# Patient Record
Sex: Female | Born: 1975
Health system: Southern US, Community
[De-identification: ages and names within clinical notes are randomized; demographics above are authoritative.]

## PROBLEM LIST (undated history)

## (undated) DIAGNOSIS — R519 Headache, unspecified: Secondary | ICD-10-CM

## (undated) DIAGNOSIS — T7840XA Allergy, unspecified, initial encounter: Secondary | ICD-10-CM

## (undated) DIAGNOSIS — J45909 Unspecified asthma, uncomplicated: Secondary | ICD-10-CM

## (undated) DIAGNOSIS — Z5189 Encounter for other specified aftercare: Secondary | ICD-10-CM

## (undated) DIAGNOSIS — E785 Hyperlipidemia, unspecified: Secondary | ICD-10-CM

## (undated) DIAGNOSIS — K589 Irritable bowel syndrome without diarrhea: Secondary | ICD-10-CM

## (undated) DIAGNOSIS — R112 Nausea with vomiting, unspecified: Secondary | ICD-10-CM

## (undated) DIAGNOSIS — R51 Headache: Secondary | ICD-10-CM

## (undated) DIAGNOSIS — F419 Anxiety disorder, unspecified: Secondary | ICD-10-CM

## (undated) DIAGNOSIS — Z8249 Family history of ischemic heart disease and other diseases of the circulatory system: Secondary | ICD-10-CM

## (undated) DIAGNOSIS — R42 Dizziness and giddiness: Secondary | ICD-10-CM

## (undated) DIAGNOSIS — K219 Gastro-esophageal reflux disease without esophagitis: Secondary | ICD-10-CM

## (undated) DIAGNOSIS — E119 Type 2 diabetes mellitus without complications: Secondary | ICD-10-CM

## (undated) DIAGNOSIS — E079 Disorder of thyroid, unspecified: Secondary | ICD-10-CM

## (undated) DIAGNOSIS — Z9889 Other specified postprocedural states: Secondary | ICD-10-CM

## (undated) HISTORY — DX: Gastro-esophageal reflux disease without esophagitis: K21.9

## (undated) HISTORY — DX: Disorder of thyroid, unspecified: E07.9

## (undated) HISTORY — PX: TONSILLECTOMY: SUR1361

## (undated) HISTORY — DX: Anxiety disorder, unspecified: F41.9

## (undated) HISTORY — DX: Unspecified asthma, uncomplicated: J45.909

## (undated) HISTORY — DX: Family history of ischemic heart disease and other diseases of the circulatory system: Z82.49

## (undated) HISTORY — DX: Dizziness and giddiness: R42

## (undated) HISTORY — DX: Encounter for other specified aftercare: Z51.89

## (undated) HISTORY — DX: Headache: R51

## (undated) HISTORY — DX: Irritable bowel syndrome, unspecified: K58.9

## (undated) HISTORY — PX: ECTOPIC PREGNANCY SURGERY: SHX613

## (undated) HISTORY — DX: Headache, unspecified: R51.9

## (undated) HISTORY — DX: Allergy, unspecified, initial encounter: T78.40XA

## (undated) HISTORY — PX: MIDDLE EAR SURGERY: SHX713

## (undated) HISTORY — DX: Hyperlipidemia, unspecified: E78.5

---

## 2008-11-01 ENCOUNTER — Emergency Department (HOSPITAL_COMMUNITY): Admission: EM | Admit: 2008-11-01 | Discharge: 2008-11-01 | Payer: Self-pay | Admitting: Emergency Medicine

## 2008-11-17 ENCOUNTER — Encounter: Payer: Self-pay | Admitting: Gastroenterology

## 2008-12-14 ENCOUNTER — Encounter: Payer: Self-pay | Admitting: Gastroenterology

## 2008-12-14 ENCOUNTER — Encounter (INDEPENDENT_AMBULATORY_CARE_PROVIDER_SITE_OTHER): Payer: Self-pay | Admitting: *Deleted

## 2008-12-14 ENCOUNTER — Ambulatory Visit (HOSPITAL_COMMUNITY): Admission: RE | Admit: 2008-12-14 | Discharge: 2008-12-14 | Payer: Self-pay | Admitting: Gastroenterology

## 2008-12-15 ENCOUNTER — Encounter: Payer: Self-pay | Admitting: Gastroenterology

## 2009-01-24 ENCOUNTER — Encounter: Payer: Self-pay | Admitting: Gastroenterology

## 2009-03-08 ENCOUNTER — Ambulatory Visit: Payer: Self-pay | Admitting: Gastroenterology

## 2009-03-08 DIAGNOSIS — E739 Lactose intolerance, unspecified: Secondary | ICD-10-CM | POA: Insufficient documentation

## 2009-03-08 DIAGNOSIS — K219 Gastro-esophageal reflux disease without esophagitis: Secondary | ICD-10-CM | POA: Insufficient documentation

## 2009-03-08 DIAGNOSIS — K589 Irritable bowel syndrome without diarrhea: Secondary | ICD-10-CM | POA: Insufficient documentation

## 2009-04-03 ENCOUNTER — Ambulatory Visit (HOSPITAL_COMMUNITY): Admission: RE | Admit: 2009-04-03 | Discharge: 2009-04-03 | Payer: Self-pay | Admitting: Obstetrics and Gynecology

## 2010-02-20 ENCOUNTER — Encounter: Admission: RE | Admit: 2010-02-20 | Discharge: 2010-02-20 | Payer: Self-pay | Admitting: Otolaryngology

## 2013-12-06 ENCOUNTER — Other Ambulatory Visit: Payer: Self-pay

## 2013-12-06 DIAGNOSIS — Z1231 Encounter for screening mammogram for malignant neoplasm of breast: Secondary | ICD-10-CM

## 2013-12-22 ENCOUNTER — Ambulatory Visit: Payer: Self-pay

## 2013-12-23 ENCOUNTER — Ambulatory Visit
Admission: RE | Admit: 2013-12-23 | Discharge: 2013-12-23 | Disposition: A | Discharge status: Home / Self Care | Payer: BC Managed Care – PPO | Source: Ambulatory Visit

## 2013-12-23 DIAGNOSIS — Z1231 Encounter for screening mammogram for malignant neoplasm of breast: Secondary | ICD-10-CM

## 2013-12-24 ENCOUNTER — Other Ambulatory Visit: Payer: Self-pay | Admitting: Obstetrics and Gynecology

## 2013-12-24 DIAGNOSIS — R928 Other abnormal and inconclusive findings on diagnostic imaging of breast: Secondary | ICD-10-CM

## 2014-01-02 ENCOUNTER — Ambulatory Visit (INDEPENDENT_AMBULATORY_CARE_PROVIDER_SITE_OTHER): Payer: BC Managed Care – PPO | Admitting: Internal Medicine

## 2014-01-02 VITALS — BP 116/78 | HR 82 | Temp 98.7°F | Resp 14 | Ht 62.5 in | Wt 138.0 lb

## 2014-01-02 DIAGNOSIS — G4700 Insomnia, unspecified: Secondary | ICD-10-CM

## 2014-01-02 DIAGNOSIS — F411 Generalized anxiety disorder: Secondary | ICD-10-CM | POA: Insufficient documentation

## 2014-01-02 MED ORDER — ESCITALOPRAM OXALATE 10 MG PO TABS: 10.0000 mg | ORAL_TABLET | Freq: Every day | ORAL | Status: DC

## 2014-01-02 MED ORDER — ALPRAZOLAM 0.5 MG PO TABS
ORAL_TABLET | ORAL | Status: DC
Start: 2014-01-02 — End: 2015-01-18

## 2014-01-02 MED ORDER — TRIAZOLAM 0.25 MG PO TABS: ORAL_TABLET | ORAL | Status: DC

## 2014-01-02 NOTE — Progress Notes (Signed)
Subjective:    Patient ID: Shelly Lewis, female    DOB: 24-Oct-1975, 38 y.o.   MRN: [id number] This chart was scribed for [name], MD by Cathie Hoops, ED Scribe. The patient was seen in Room 2. The patient's care was started at 11:55 AM.   Chief Complaint  Patient presents with  . Anxiety   Patient Active Problem List   Diagnosis Date Noted  . LACTOSE INTOLERANCE 03/08/2009  . ESOPHAGEAL REFLUX 03/08/2009  . IBS 03/08/2009   Past Medical History  Diagnosis Date  . Allergy   . Anxiety   . Asthma   . Blood transfusion without reported diagnosis   . GERD (gastroesophageal reflux disease)   . Thyroid disease    No current outpatient prescriptions on file prior to visit.   No current facility-administered medications on file prior to visit.   No Known Allergies  HPI HPI Comments: Shelly Lewis is a 38 y.o. female who presents to the [facility] complaining of an anxiety disorder onset middle school. Pt states she has been suffering with her anxiety for the past several years. Pt states she works with [employer] and received a recommendation for Dr. Laney Pastor. She first noted anx in Mid sch, then HS and college Pt reports turmoil and her stress in her family life from an early age due to her parents' divorce. Pt states her parents constantly argued.  Pt reports her father is anxious but it comes out as requiring order. Pt states her sibllings are ages: 82, 71, and 30. Pt reports her older sister, had post-partum high anxiety but her anxiety has since been resolved. Pt states she has two children, ages 18 and 55, and both have diagnosed anxiety disorders. Pt reports only her son requires medication for his anxiety symptoms(lexapro)    Pt states she is having trouble staying asleep at night. Pt reports she regularly wakes up 3x/night. Pt reports in the mornings she often feeling fatigued. Pt reports associated chest tightness and GI issues. Pt states she  has tried melatonin before bed without relief. Pt denies feeling as if she is ever too tired to drive. Pt reports that 3 years ago, she often had panic attacks 3x/week. Pt reports she has had panic attacks 3 times this summer. Pt denies anything about these recent panic attacks was predictable. Pt states she was dealing with end-of-school year stress and her summer job hasn't been going well.   Pt reports her parents are around and her mother is more stressful than she is relieving. Pt reports her mother is often critical of her, and often tells her what she is doing wrong. Pt denies her mother is critical of her kids.   Pt states she went to  [employer] and majored in Scientist, physiological. Pt reports her mother was very critical of her, while she was in college.  Pt reports her anxiety didn't penalize her from a grade standpoint or relationship standpoint. Pt reports being married for 15 years. Pt reports when she is anxious she has very little patience with her husband. Pt reports going to therapy on her own. Pt reports this year has been a hard year for her marriage but it has started to improve. Pt states her husband is willing to do his part and he states he is willing to go see a therapist. Pt states her husband is a good father.   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 Pt states this will be her third year at [employer] and she gets along well with her principal. Pt has found she gets extremely anxious at the beginning of the school year. Pt states she likes routine and order but has not found this at her school. 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   Past Surgical History  Procedure Laterality Date  . Tonsillectomy    . Middle ear surgery-    . Ectopic pregnancy surgery-R     [place]  [facility] [place]-gave essential oils for her anxiety--no change Counseling twice in past and helped once Has appt Eino Farber but not til Sept  Review of Systems  Constitutional: Negative for appetite change and unexpected weight change.  Eyes: Negative for visual disturbance.  Cardiovascular: Negative for chest pain and palpitations.  Gastrointestinal: Negative for abdominal pain.  Neurological: Negative for headaches.  Psychiatric/Behavioral: Negative for behavioral problems, confusion, self-injury and dysphoric mood.  hx ? Abn mammo 7/15 under eval-neg fam hx on Armour Thyroid from her  physician in [place] due to hypothyroidism Claritin for allergies    Objective:   Physical Exam  Nursing note and vitals reviewed. Constitutional: She is oriented to person, place, and time. She appears well-developed and well-nourished. No distress.  Tears at times w/ disc about Mom  HENT:  Head: Normocephalic and atraumatic.  Eyes: Conjunctivae and EOM are normal. Pupils are equal, round, and reactive to light.  Neck: Neck supple.  Cardiovascular: Normal rate.   Pulmonary/Chest: Effort normal.  Musculoskeletal: Normal range of motion.  Neurological: She is alert and oriented to person, place, and time.  Skin: Skin is warm and dry.   her mood is stable. Her affect is appropriate for the amount of stress she perceives. Her thought content is normal. Her judgment is sound.    Triage Vitals: BP 116/78  Pulse 82  Temp(Src) 98.7 F (37.1 C) (Oral)  Resp 14  Ht 5' 2.5" (1.588 m)  Wt 138 lb (62.596 kg)  BMI 24.82 kg/m2  SpO2 98%  LMP 12/03/2013    77mnute OV Assessment & Plan:  GAD (generalized anxiety disorder)  Start lexapro 10 and f/u 1 month  Xanax if panics  discussed alternative approaches to classroom/parents  Disc self est issues from parental divorce Insomnia  Trial halcion when she wakes  Meds ordered this encounter  Medications  . escitalopram (LEXAPRO) 10 MG  tablet    Sig: Take 1 tablet (10 mg total) by mouth daily.    Dispense:  30 tablet    Refill:  2  . ALPRAZolam (XANAX) 0.5 MG tablet    Sig: 1/2 - 1 at onset of anxiety. No more than 1 every 8 hrs    Dispense:  30 tablet    Refill:  1  . triazolam (HALCION) 0.25 MG tablet    Sig: 1 tab when wakes at night    Dispense:  15 tablet    Refill:  1

## 2014-01-03 ENCOUNTER — Ambulatory Visit
Admission: RE | Admit: 2014-01-03 | Discharge: 2014-01-03 | Disposition: A | Discharge status: Home / Self Care | Payer: BC Managed Care – PPO | Source: Ambulatory Visit | Attending: Obstetrics and Gynecology | Admitting: Obstetrics and Gynecology

## 2014-01-03 DIAGNOSIS — R928 Other abnormal and inconclusive findings on diagnostic imaging of breast: Secondary | ICD-10-CM

## 2014-01-04 NOTE — Progress Notes (Signed)
LMVM to CB to schedule appt with Dr. Laney Pastor.

## 2014-01-20 NOTE — Progress Notes (Signed)
Left a message for patient to return call to schedule a follow up appointment with Dr. Laney Pastor

## 2014-02-02 ENCOUNTER — Encounter: Payer: Self-pay | Admitting: Internal Medicine

## 2014-02-02 ENCOUNTER — Ambulatory Visit (INDEPENDENT_AMBULATORY_CARE_PROVIDER_SITE_OTHER): Payer: BC Managed Care – PPO | Admitting: Internal Medicine

## 2014-02-02 VITALS — BP 110/74 | HR 70 | Temp 98.3°F | Resp 16 | Ht 62.0 in | Wt 135.0 lb

## 2014-02-02 DIAGNOSIS — Z23 Encounter for immunization: Secondary | ICD-10-CM

## 2014-02-02 DIAGNOSIS — G4700 Insomnia, unspecified: Secondary | ICD-10-CM

## 2014-02-02 DIAGNOSIS — F411 Generalized anxiety disorder: Secondary | ICD-10-CM

## 2014-02-02 MED ORDER — ESCITALOPRAM OXALATE 5 MG PO TABS: 10.0000 mg | ORAL_TABLET | Freq: Every day | ORAL | Status: DC

## 2014-02-02 NOTE — Progress Notes (Addendum)
   Subjective:    Patient ID: Shelly Lewis, female    DOB: 09/25/1975, 38 y.o.   MRN: [id number]  This chart was scribed for Shelly Koyanagi, MD by Rosary Lively, ED scribe. This patient was seen in room Room/bed 21 and the patient's care was started at 4:37 PM.  Chief Complaint  Patient presents with  . Follow-up    anxiety and new meds     HPI  HPI Comments:  Shelly Lewis is a 38 y.o. female who presents to [facility] for a follow-up visit concerning anxiety and new medication. Pt reports that the last month has been better, but she still has not been able to sleep. Pt reports that she began with a full dose of Lexapro, and had a horrible night with associated symptoms of emesis. Started over w/ 2.5 and slowly incr to 5///then 7.5 but was improving at 5 so went back there to avoid further GI sxt. Pt reports that she gradually increased her dose to 5 mg, and still experienced mild GI issues, however she did report she felt better. Pt reports that she has not yet tried the other medication, but may try this weekend. Pt reports that she has not had a panic attack. Pt states that she has had a much better start to her school year.  Husband has noted big difference.  Visit from mom 2 weeks disclosed many conflicts   Wt Readings from Last 3 Encounters:  02/02/14 135 lb (61.236 kg)  01/02/14 138 lb (62.596 kg)  03/08/09 121 lb (54.885 kg)    Review of Systems  Psychiatric/Behavioral: Positive for sleep disturbance.       Objective:   Physical Exam  Nursing note and vitals reviewed. Constitutional: She is oriented to person, place, and time. She appears well-developed and well-nourished.  HENT:  Head: Normocephalic and atraumatic.  Eyes: EOM are normal.  Neck: Normal range of motion. Neck supple.  Cardiovascular: Normal rate.   Pulmonary/Chest: Effort normal.  Musculoskeletal: Normal range of motion.  Neurological: She is alert and oriented to person, place, and time.  Skin:  Skin is warm and dry.  Psychiatric: She has a normal mood and affect. Her behavior is normal.      BP 110/74  Pulse 70  Temp(Src) 98.3 F (36.8 C) (Oral)  Resp 16  Ht 5' 2"  (1.575 m)  Wt 135 lb (61.236 kg)  BMI 24.69 kg/m2  SpO2 98%  LMP 01/07/2014  Assessment & Plan:  Need for prophylactic vaccination and inoculation against influenza - Plan: Flu Vaccine QUAD 36+ mos IM  GAD--cont lexapr 62m///halcion trial if needed/xan for panic  I have completed the patient encounter in its entirety as documented by the scribe, with editing by me where necessary. Shelly Lewis P. Shelly Lewis M.D.  Meds ordered this encounter  Medications  . escitalopram (LEXAPRO) 5 MG tablet    Sig: Take 2 tablets (10 mg total) by mouth daily.    Dispense:  90 tablet    Refill:  3

## 2014-04-20 ENCOUNTER — Ambulatory Visit: Payer: BC Managed Care – PPO | Admitting: Internal Medicine

## 2015-01-13 ENCOUNTER — Other Ambulatory Visit: Payer: Self-pay | Admitting: Internal Medicine

## 2015-01-18 ENCOUNTER — Ambulatory Visit (INDEPENDENT_AMBULATORY_CARE_PROVIDER_SITE_OTHER): Payer: BLUE CROSS/BLUE SHIELD

## 2015-01-18 ENCOUNTER — Encounter: Payer: Self-pay | Admitting: Podiatry

## 2015-01-18 ENCOUNTER — Ambulatory Visit (INDEPENDENT_AMBULATORY_CARE_PROVIDER_SITE_OTHER): Payer: BLUE CROSS/BLUE SHIELD | Admitting: Podiatry

## 2015-01-18 VITALS — BP 107/75 | HR 73 | Resp 16 | Ht 62.0 in | Wt 151.0 lb

## 2015-01-18 DIAGNOSIS — M79671 Pain in right foot: Secondary | ICD-10-CM | POA: Diagnosis not present

## 2015-01-18 DIAGNOSIS — M79672 Pain in left foot: Secondary | ICD-10-CM

## 2015-01-18 DIAGNOSIS — M2040 Other hammer toe(s) (acquired), unspecified foot: Secondary | ICD-10-CM

## 2015-01-18 DIAGNOSIS — M779 Enthesopathy, unspecified: Secondary | ICD-10-CM | POA: Diagnosis not present

## 2015-01-18 MED ORDER — TRIAMCINOLONE ACETONIDE 10 MG/ML IJ SUSP
10.0000 mg | Freq: Once | INTRAMUSCULAR | Status: AC
  Administered 2015-01-18: 10 mg

## 2015-01-18 NOTE — Progress Notes (Signed)
Subjective:     Patient ID: Shelly Lewis, female   DOB: 12/08/1975, 39 y.o.   MRN: [id number]  HPI patient states I've had pain in my left over right foot in the second joint and it makes it hard to walk at times. I don't know what I may have done to cause this   Review of Systems  All other systems reviewed and are negative.      Objective:   Physical Exam  Constitutional: She is oriented to person, place, and time.  Cardiovascular: Intact distal pulses.   Musculoskeletal: Normal range of motion.  Neurological: She is oriented to person, place, and time.  Skin: Skin is warm.  Nursing note and vitals reviewed.  neurovascular status intact muscle strength adequate range of motion within normal limits and patient is noted to have moderate depression of the arch bilateral with structural bunion deformity bilateral and inflammation with fluid around the second metatarsophalangeal joint left over right with mild deviation of the second toe. Patient is well oriented 3 and has good digital perfusion     Assessment:     Inflammatory capsulitis second MPJ left over right with possible flexor plate stretch and foot structure as a precipitating factor    Plan:     H&P and x-rays reviewed with patient. At this point I did do a proximal nerve block left aspirated the joint was able to get out a small amount of clear fluid and injected with half cc of dexamethasone Kenalog and applied thick plantar padding. Discussed long-term orthotic therapy and reviewed this with her we will discussed at next visit

## 2015-01-18 NOTE — Progress Notes (Signed)
   Subjective:    Patient ID: Shelly Lewis, female    DOB: 09/27/1975, 39 y.o.   MRN: [id number]  HPI Patient presents with bilateral foot pain, ball of feet. Pt stated, "has more pain in left foot". Pain has gotten worse over the past 2 months.   Review of Systems  Constitutional: Positive for fatigue and unexpected weight change.  HENT: Positive for sinus pressure and tinnitus.   Psychiatric/Behavioral: The patient is nervous/anxious.   All other systems reviewed and are negative.      Objective:   Physical Exam        Assessment & Plan:

## 2015-02-08 ENCOUNTER — Ambulatory Visit: Payer: BLUE CROSS/BLUE SHIELD | Admitting: Podiatry

## 2015-08-22 ENCOUNTER — Ambulatory Visit (INDEPENDENT_AMBULATORY_CARE_PROVIDER_SITE_OTHER): Payer: BLUE CROSS/BLUE SHIELD | Admitting: Cardiovascular Disease

## 2015-08-22 ENCOUNTER — Encounter: Payer: Self-pay | Admitting: Cardiovascular Disease

## 2015-08-22 VITALS — BP 114/86 | HR 83 | Ht 62.0 in | Wt 146.3 lb

## 2015-08-22 DIAGNOSIS — E785 Hyperlipidemia, unspecified: Secondary | ICD-10-CM | POA: Diagnosis not present

## 2015-08-22 DIAGNOSIS — Z8249 Family history of ischemic heart disease and other diseases of the circulatory system: Secondary | ICD-10-CM

## 2015-08-22 HISTORY — DX: Hyperlipidemia, unspecified: E78.5

## 2015-08-22 HISTORY — DX: Family history of ischemic heart disease and other diseases of the circulatory system: Z82.49

## 2015-08-22 NOTE — Progress Notes (Signed)
Cardiology Office Note   Date:  08/22/2015   ID:  Shelly Lewis, DOB 1975-07-20, MRN [id number]  PCP:  Zane Herald, MD  Cardiologist:   Sharol Harness, MD   Chief Complaint  Patient presents with  . New Evaluation    Establish Care--Family Hx of Heart Disease  pt states she has had high cholesterol since she was 13--wanted to establish care because her cousin just died from MI      History of Present Illness: Shelly Lewis is a 40 y.o. female with hypothyroidism and asthma who presents to establish care due to a family history of CAD.  Shelly Lewis has been feeling well.  She denies chest pain or exertional shortness of breath.  She sometimes notices that she gets out of breath before finishing a sentence.  She also occasionally has palpitations but these are triggered by caffeine.  She had chest pain in the past that she learned is from GERD.  It has improved since eliminating certain foods.  Shelly Lewis decided to come for evaluation today because her 72 year old cousin died of a heart attack last week.  She has several family members with premature CAD.  She has a longstanding history of hyperlipidemia and was previously on atorvastatin.  However she stopped it when she became pregnant.  After pregnancy she started it again but did not tolerate it well. She notes that she been gaining weight lately.  She hasn't had much time to exercise lately.  She has two children, one with autism and another with ADHD.  They also recently accepted a foster child who has special needs.  She works as a Recruitment consultant.  She has been working on her diet and eating very healthily.  Despite this, her recent lab work with her PCP revealed elevated lipids and her hemoglobin A1c was 6.3%.    Past Medical History  Diagnosis Date  . Allergy   . Anxiety   . Asthma   . Blood transfusion without reported diagnosis   . GERD (gastroesophageal reflux disease)   . Thyroid disease      Hypothyroidism  . IBS (irritable bowel syndrome)   . Dizziness   . Family history of premature coronary artery disease 08/22/2015  . Hyperlipidemia 08/22/2015    Past Surgical History  Procedure Laterality Date  . Tonsillectomy    . Middle ear surgery    . Ectopic pregnancy surgery       Current Outpatient Prescriptions  Medication Sig Dispense Refill  . ARMOUR THYROID 30 MG tablet Take 2 tablets by mouth every morning.  3  . escitalopram (LEXAPRO) 5 MG tablet Take 2 tablets (10 mg total) by mouth daily. PATIENT NEEDS OFFICE VISIT FOR ADDITIONAL REFILLS 60 tablet 0   No current facility-administered medications for this visit.    Allergies:   Mold extract; Gluten meal; and Lac bovis    Social History:  The patient  reports that she has never smoked. She has never used smokeless tobacco. She reports that she drinks alcohol. She reports that she does not use illicit drugs.   Family History:  The patient's family history includes Cancer in her maternal grandmother; Depression in her mother; Diabetes in her father; Heart disease in her maternal grandmother and paternal grandmother; Hyperlipidemia in her father, mother, and paternal grandfather; Stroke in her paternal grandfather.    ROS:  Please see the history of present illness.   Otherwise, review of systems are positive  for none.   All other systems are reviewed and negative.    PHYSICAL EXAM: VS:  BP 114/86 mmHg  Pulse 83  Ht 5' 2"  (1.575 m)  Wt 66.361 kg (146 lb 4.8 oz)  BMI 26.75 kg/m2 , BMI Body mass index is 26.75 kg/(m^2). GENERAL:  Well appearing HEENT:  Pupils equal round and reactive, fundi not visualized, oral mucosa unremarkable NECK:  No jugular venous distention, waveform within normal limits, carotid upstroke brisk and symmetric, no bruits, no thyromegaly LYMPHATICS:  No cervical adenopathy LUNGS:  Clear to auscultation bilaterally HEART:  RRR.  PMI not displaced or sustained,S1 and S2 within normal  limits, no S3, no S4, no clicks, no rubs, no murmurs ABD:  Flat, positive bowel sounds normal in frequency in pitch, no bruits, no rebound, no guarding, no midline pulsatile mass, no hepatomegaly, no splenomegaly EXT:  2 plus pulses throughout, no edema, no cyanosis no clubbing SKIN:  No rashes no nodules NEURO:  Cranial nerves II through XII grossly intact, motor grossly intact throughout PSYCH:  Cognitively intact, oriented to person place and time  EKG:  EKG is ordered today. The ekg ordered today demonstrates sinus rhythm.  Rate 83 bpm.    Recent Labs: No results found for requested labs within last 365 days.    Lipid Panel No results found for: CHOL, TRIG, HDL, CHOLHDL, VLDL, LDLCALC, LDLDIRECT   04/22/15: Cholesterol 246, tri 94, HDL 42, LDL 185 Hgb A1c 6.3% Wt Readings from Last 3 Encounters:  08/22/15 66.361 kg (146 lb 4.8 oz)  01/18/15 68.493 kg (151 lb)  02/02/14 61.236 kg (135 lb)      ASSESSMENT AND PLAN:  # CV Risk Assessment:  Shelly Lewis's ASCVD 10 year risk is 1.2%.  We discussed the importance of increasing her physical activity.  She already has a healthy diet.  Her lipids are elevated and over time she will likely need a statin.  However, based on this 10 year risk, it is not interested at this time.  We will obtain a coronary calcium score to better risk stratify her and determine whether she should be on aspirin or a statin.   Current medicines are reviewed at length with the patient today.  The patient does not have concerns regarding medicines.  The following changes have been made:  no change  Labs/ tests ordered today include:   Orders Placed This Encounter  Procedures  . EKG 12-Lead  . CT HEART W/O CONTRAST MEDIA - QUANT EVAL CORONARY CALCIUM     Disposition:   FU with [name], MD, [facility] in 1 year   This note was written with the assistance of speech recognition software.  Please excuse any transcriptional errors.  Signed, Ander Wamser  C. Oval Linsey, [place], [facility]  08/22/2015 8:17 PM    [place]

## 2015-08-22 NOTE — Patient Instructions (Signed)
Medication Instructions:  Your physician recommends that you continue on your current medications as directed. Please refer to the Current Medication list given to you today.  Labwork: none  Testing/Procedures: Coronary calcium score without contrast  Follow-Up: Your physician wants you to follow-up in: 1 year ov You will receive a reminder letter in the mail two months in advance. If you don't receive a letter, please call our office to schedule the follow-up appointment.  If you need a refill on your cardiac medications before your next appointment, please call your pharmacy.

## 2015-08-23 NOTE — Addendum Note (Signed)
Addended by: Alvina Filbert B on: 08/23/2015 05:05 PM   Modules accepted: Orders

## 2015-08-23 NOTE — Addendum Note (Signed)
Addended by: Alvina Filbert B on: 08/23/2015 12:41 PM   Modules accepted: Orders

## 2015-08-25 ENCOUNTER — Telehealth: Payer: Self-pay | Admitting: Cardiovascular Disease

## 2015-08-25 NOTE — Telephone Encounter (Signed)
LEFT MESSAGE TO CALL BACK  INSTRUCTION CALCIUM SCORING APPOINTMENT  Wednesday 08/30/15 AT [address] OFFICE  BE THERE AT 8:45 AM IT TAKES @5  MINS BRING $150 DAY OF TEST - NOT COVERED BY INSURANCE IF NEEDS TO CHANGE CALL  STACEY [phone] [phone]

## 2015-08-25 NOTE — Telephone Encounter (Signed)
Pt called to check on the status of test she is supposed to have scheduled.

## 2015-08-25 NOTE — Telephone Encounter (Signed)
PATIENT RETURN CALL INFORMATION GIVEN  PATIENT VERBALIZED UNDERSTANDING AND STATES THE TIME IS OKAY

## 2015-08-30 ENCOUNTER — Ambulatory Visit (INDEPENDENT_AMBULATORY_CARE_PROVIDER_SITE_OTHER)
Admission: RE | Admit: 2015-08-30 | Discharge: 2015-08-30 | Disposition: A | Discharge status: Home / Self Care | Payer: BLUE CROSS/BLUE SHIELD | Source: Ambulatory Visit | Attending: Cardiovascular Disease | Admitting: Cardiovascular Disease

## 2015-08-30 DIAGNOSIS — Z8249 Family history of ischemic heart disease and other diseases of the circulatory system: Secondary | ICD-10-CM

## 2015-08-30 DIAGNOSIS — E785 Hyperlipidemia, unspecified: Secondary | ICD-10-CM

## 2015-08-31 ENCOUNTER — Telehealth: Payer: Self-pay | Admitting: Cardiovascular Disease

## 2015-08-31 NOTE — Telephone Encounter (Signed)
-----   Message from Skeet Latch, MD sent at 08/30/2015  7:21 PM EDT ----- Coronary calcium score is 0, which means she has very low risk of developing significant coronary artery disease over the next several years.  It will be very important to work on diet and exercise to prevent heart disease in the future.

## 2015-08-31 NOTE — Telephone Encounter (Signed)
Advised patient of results.  

## 2015-08-31 NOTE — Telephone Encounter (Signed)
New message ° ° ° ° ° °Returning a call to the nurse to get test results °

## 2016-12-20 DIAGNOSIS — H6992 Unspecified Eustachian tube disorder, left ear: Secondary | ICD-10-CM | POA: Insufficient documentation

## 2016-12-20 DIAGNOSIS — H906 Mixed conductive and sensorineural hearing loss, bilateral: Secondary | ICD-10-CM | POA: Insufficient documentation

## 2016-12-20 DIAGNOSIS — Z9622 Myringotomy tube(s) status: Secondary | ICD-10-CM | POA: Insufficient documentation

## 2016-12-20 DIAGNOSIS — H9193 Unspecified hearing loss, bilateral: Secondary | ICD-10-CM | POA: Insufficient documentation

## 2018-08-03 ENCOUNTER — Encounter: Payer: Self-pay | Admitting: Neurology

## 2018-08-05 ENCOUNTER — Encounter: Payer: Self-pay | Admitting: Neurology

## 2018-08-05 ENCOUNTER — Ambulatory Visit: Payer: BLUE CROSS/BLUE SHIELD | Admitting: Neurology

## 2018-08-05 VITALS — BP 121/85 | HR 98 | Ht 62.0 in | Wt 159.0 lb

## 2018-08-05 DIAGNOSIS — K219 Gastro-esophageal reflux disease without esophagitis: Secondary | ICD-10-CM | POA: Diagnosis not present

## 2018-08-05 DIAGNOSIS — G479 Sleep disorder, unspecified: Secondary | ICD-10-CM

## 2018-08-05 DIAGNOSIS — G478 Other sleep disorders: Secondary | ICD-10-CM

## 2018-08-05 DIAGNOSIS — E7800 Pure hypercholesterolemia, unspecified: Secondary | ICD-10-CM | POA: Diagnosis not present

## 2018-08-05 DIAGNOSIS — R0683 Snoring: Secondary | ICD-10-CM

## 2018-08-05 DIAGNOSIS — F411 Generalized anxiety disorder: Secondary | ICD-10-CM

## 2018-08-05 NOTE — Progress Notes (Signed)
SLEEP MEDICINE CLINIC   Provider:  Larey Seat, MD  Primary Care Physician:  Zane Herald, MD   Referring Provider: Domenica Reamer, PA   Chief Complaint  Patient presents with  . New Patient (Initial Visit)    pt alone, rm 10. pt states that she snores and never gets good sleep. never had a sleep study done before. pt complains of some fatigue during the day. avg about 3 hrs of uninterupted sleep.    HPI:  Shelly Lewis is a 43 y.o. female patient and new to [place] , seen upon referral from [place] , [place] on 08-05-2018. Shelly Lewis has been treated for a generalized anxiety disorder, but she is referred to be evaluated for a sleep study.  She just started taking Lexapro at a higher dose in early November 2019 and it has so far not made a difference in her sleep quality.  She recalls last having felt refreshed and restored when waking up from nocturnal sleep 16 years ago before the birth of her first child. She reports that her second child did not sleep through the night until age 77, and soon after they began fostering children.  She now snores and her husbands snoring disturbs her sleep too.   Chief complaint according to patient :see above   Sleep habits are as follows: dinner time is 5-6 Pm , and the family consists of 6 people. One of the adopted younger children has autism, is main streamed in public school.  The evening is filled with homework supervision, laundry and dishes. She starts the bedtime regime which with the autistic child can last up to an hour. She will be in bed before 10 PM.  Shelly Lewis describes being able to fall asleep anytime anywhere and the same is true at night.  She does not have a long sleep latency her problem is that she cannot stay asleep.  Marital bedroom is cool, quiet and dark.  She is a restless sleeper and does not have a preferred sleep position, she sleeps with 3 pillows.  Her first arousal from sleep is usually  in the early morning hours, between midnight and 2 most nights.  She is not always able to identify the trigger for her arousal but sometimes it is her husband's snoring.  She recalls dreaming, her sleep is more fragmented and less restorative for the last 2 to 3 hours. Nocturia times 2. Rising time is between 5-6 AM. Naps on weekends- up to 2 hours in length, sometimes leaving her more groggy.    Sleep medical history; tonsillectomy, sinus surgery, ear tubes, reconstructive surgery left eardrum,  Family sleep history: mother has OSA, father snores.   Social history:  Married, lives with 2 adopted and 2 biological children.  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 Non smoker, rare drinker " wine puts me to sleep" . Caffeine - 2 cups of coffee and one can of soda daily.      Review of Systems: Out of a complete 14 system review, the patient complains of only the following symptoms, and all other reviewed systems are negative. Snoring, not sure if she has apnea. No HTN, but prediabetes.  Depression/ dysthymia, anxiety.  Status post ectopic pregnancy with rupture.      Epworth Sleepiness score : 16/ 24 points   , Fatigue severity score: 51/ 63  , depression score n/a    Social History   Socioeconomic History  .  Marital status: Married    Spouse name: Not on file  . Number of children: Not on file  . Years of education: Not on file  . Highest education level: Not on file  Occupational History  . Not on file  Social Needs  . Financial resource strain: Not on file  . Food insecurity:    Worry: Not on file    Inability: Not on file  . Transportation needs:    Medical: Not on file    Non-medical: Not on file  Tobacco Use  . Smoking status: Never Smoker  . Smokeless tobacco: Never Used  Substance and Sexual Activity  . Alcohol use: Yes    Alcohol/week: 4.0 standard drinks    Types: 4 Glasses of wine per week    Comment: at dinner  . Drug use:  No  . Sexual activity: Not on file  Lifestyle  . Physical activity:    Days per week: Not on file    Minutes per session: Not on file  . Stress: Not on file  Relationships  . Social connections:    Talks on phone: Not on file    Gets together: Not on file    Attends religious service: Not on file    Active member of club or organization: Not on file    Attends meetings of clubs or organizations: Not on file    Relationship status: Not on file  . Intimate partner violence:    Fear of current or ex partner: Not on file    Emotionally abused: Not on file    Physically abused: Not on file    Forced sexual activity: Not on file  Other Topics Concern  . Not on file  Social History Narrative  . Not on file    Family History  Problem Relation Age of Onset  . Hyperlipidemia Mother   . Depression Mother   . Diabetes Father   . Hyperlipidemia Father   . Cancer Maternal Grandmother   . Heart disease Maternal Grandmother   . Heart disease Paternal Grandmother   . Hyperlipidemia Paternal Grandfather   . Stroke Paternal Grandfather     Past Medical History:  Diagnosis Date  . Allergy   . Anxiety   . Asthma   . Blood transfusion without reported diagnosis   . Dizziness   . Family history of premature coronary artery disease 08/22/2015  . GERD (gastroesophageal reflux disease)   . Headache   . Hyperlipidemia 08/22/2015  . IBS (irritable bowel syndrome)   . Thyroid disease    Hypothyroidism    Past Surgical History:  Procedure Laterality Date  . ECTOPIC PREGNANCY SURGERY    . MIDDLE EAR SURGERY    . TONSILLECTOMY        Allergies as of 08/05/2018 - Review Complete 08/05/2018  Allergen Reaction Noted  . Mold extract [trichophyton] Other (See Comments) 08/22/2015  . Gluten meal Diarrhea 01/18/2015  . Lac bovis Diarrhea 08/22/2015    Vitals: BP 121/85   Pulse 98   Ht 5' 2"  (1.575 m)   Wt 159 lb (72.1 kg)   BMI 29.08 kg/m  Last Weight:  Wt Readings from Last 1  Encounters:  08/05/18 159 lb (72.1 kg)   OAC:ZYSA mass index is 29.08 kg/m.     Last Height:   Ht Readings from Last 1 Encounters:  08/05/18 5' 2"  (1.575 m)    Physical exam:  General: The patient is awake, alert and appears not in acute distress.  The patient is well groomed. Head: Normocephalic, atraumatic. Neck is supple. Mallampati : 3,  neck circumference: 14 ". Nasal airflow patent, bruxism marks. Retrognathia is not seen. Crowded lower jaw. Mouthguard was made, but she can't tolerate it.  Cardiovascular:  Regular rate and rhythm, without  murmurs or carotid bruit, and without distended neck veins. Respiratory: Lungs are clear to auscultation. Skin:  Without evidence of edema, or rash Trunk: BMI is 29. The patient's posture is erect.   Neurologic exam :The patient is awake and alert, oriented to place and time.  Attention span & concentration ability appears normal.  Speech is fluent, without dysarthria, dysphonia or aphasia.  Mood and affect are appropriate.  Cranial nerves:Pupils are equal and briskly reactive to light.  Extraocular movements  in vertical and horizontal planes intact and without nystagmus. Visual fields by finger perimetry are intact. Hearing to finger rub intact.  Facial sensation intact to fine touch. Facial motor strength is symmetric and tongue and uvula move midline. Shoulder shrug was symmetrical.   Motor exam:  Normal tone, muscle bulk and symmetric strength in all extremities.  There was no pronator drift, but bilaterally her grip strength is rather weak.  Sensory:  Fine touch, pinprick and vibration were tested in all extremities. Proprioception tested in the upper extremities was normal.  Coordination: Rapid alternating movements in the fingers/hands was normal. Finger-to-nose maneuver normal without evidence of ataxia, dysmetria or tremor.  Gait and station: Patient walks without assistive device. Strength within normal limits. Stance is stable and  normal.   Deep tendon reflexes: in the  upper and lower extremities are symmetric and intact.   Assessment:  After physical and neurologic examination, review of laboratory studies,  Personal review of pre-existing records as far as provided in visit., my assessment is:   1) Shelly Lewis is a generally healthy 43 year old Caucasian right-handed female with a history of an entrained sleep disorder. Fragmentation of sleep, and a lesser sleep quality became noticeable with the birth of her first but also her second child.  Her daughter did not sleep through the night until 45 years of age, in 2011.  This is fostering an adoption of her youngest children that were all the additional sleep interruptions and difficulties, giving that 1 of them is autistic.  She has established a routine for bedtime and sleep time with her children and adheres to it herself.  I will still give her the sleep plan one-on-one also called " the insomnia [facility] instructions. I will order a home sleep test since my intention is to screen for the presence of sleep apnea.  There has been no evidence of narcoleptic attacks, restless legs have not been endorsed and headaches have been less frequent at least of the migrainous kind.  She does endorse racing thoughts when she wakes up and this was much likely a correlate with anxiety or depression.  The patient was advised of the nature of the diagnosed disorder , the treatment options and the  risks for general health and wellness arising from not treating the condition.   I spent more than 35 minutes of face to face time with the patient.  Greater than 50% of time was spent in counseling and coordination of care. We have discussed the diagnosis and differential and I answered the patient's questions.    Plan:  Treatment plan and additional workup :  Attended sleep study , if insurance permits, otherwise using a screening test for OSA , the watch pat.  Larey Seat, MD  7/82/4235, 3:61 PM  Certified in Neurology by ABPN Certified in [place] by [facility] [address], [place],  [address]

## 2018-12-16 ENCOUNTER — Ambulatory Visit (INDEPENDENT_AMBULATORY_CARE_PROVIDER_SITE_OTHER): Payer: BC Managed Care – PPO | Admitting: Neurology

## 2018-12-16 ENCOUNTER — Other Ambulatory Visit: Payer: Self-pay

## 2018-12-16 DIAGNOSIS — F411 Generalized anxiety disorder: Secondary | ICD-10-CM

## 2018-12-16 DIAGNOSIS — G4710 Hypersomnia, unspecified: Secondary | ICD-10-CM | POA: Diagnosis not present

## 2018-12-16 DIAGNOSIS — R0683 Snoring: Secondary | ICD-10-CM

## 2018-12-16 DIAGNOSIS — G478 Other sleep disorders: Secondary | ICD-10-CM

## 2018-12-16 DIAGNOSIS — G4719 Other hypersomnia: Secondary | ICD-10-CM

## 2018-12-16 DIAGNOSIS — G479 Sleep disorder, unspecified: Secondary | ICD-10-CM

## 2018-12-23 DIAGNOSIS — G4719 Other hypersomnia: Secondary | ICD-10-CM | POA: Insufficient documentation

## 2018-12-23 DIAGNOSIS — G478 Other sleep disorders: Secondary | ICD-10-CM | POA: Insufficient documentation

## 2018-12-23 DIAGNOSIS — R0683 Snoring: Secondary | ICD-10-CM | POA: Insufficient documentation

## 2018-12-23 DIAGNOSIS — G479 Sleep disorder, unspecified: Secondary | ICD-10-CM | POA: Insufficient documentation

## 2018-12-23 NOTE — Procedures (Signed)
PATIENT'S NAME:  Shelly Lewis, Shelly Lewis  DOB:      Mar 04, 1976      MR#:    [id number]     DATE OF RECORDING: 12/16/2018 REFERRING M.D.:  Debbora Dus, NP Study Performed:   Baseline Polysomnogram with expanded EEG montage HISTORY: Mrs. Basto has been treated for a generalized anxiety disorder, but she is referred to be evaluated for a sleep study.  She just started taking Lexapro at a higher dose in early November 2019 and it has so far not made a difference in her sleep quality.  She recalls last having felt refreshed and restored when waking up from nocturnal sleep 16 years ago, before the birth of her first child. She reports that her second child did not sleep through the night until age 17, and soon after they began fostering children.  She now snores and her husband's snoring disturbs her sleep too. She is excessively daytime sleepy. She has a thyroid condition.     The patient endorsed the Epworth Sleepiness Scale at 16 points.   The patient's weight 159 pounds with a height of 62 (inches), resulting in a BMI of 29.2 kg/m2.  CURRENT MEDICATIONS:  Lexapro, Armour Thyroid   PROCEDURE:  This is a multichannel digital polysomnogram utilizing the Somnostar 11.2 system.  Electrodes and sensors were applied and monitored per AASM Specifications.   EEG, EOG, Chin and Limb EMG, were sampled at 200 Hz.  ECG, Snore and Nasal Pressure, Thermal Airflow, Respiratory Effort, CPAP Flow and Pressure, Oximetry was sampled at 50 Hz. Digital video and audio were recorded.      BASELINE STUDY= Lights Out was at 21:51 and Lights On at 05:00.  Total recording time (TRT) was 429.5 minutes, with a total sleep time (TST) of 338.5 minutes.  The patient's sleep latency was 13.5 minutes.  REM latency was 349.5 minutes.  The sleep efficiency was 78.8 %. There was a prolonged period of wakefulness (40 minutes) with a bathroom break at one o'clock.    SLEEP ARCHITECTURE: WASO (Wake after sleep onset) was 77.5 minutes.  There were 46  minutes in Stage N1, 207.5 minutes Stage N2, 46 minutes Stage N3 and 39 minutes in Stage REM.  The percentage of Stage N1 was 13.6%, Stage N2 was 61.3%, Stage N3 was 13.6% and Stage R (REM sleep) was 11.5%.   RESPIRATORY ANALYSIS:  There were a total of 13 respiratory events:  2 obstructive apneas, 11 hypopneas and 0 respiratory event related arousals (RERAs).     The total APNEA/HYPOPNEA INDEX (AHI) was 2.3 /hour. 8 events occurred in REM sleep and 10 events in NREM. The REM AHI was 12.3 /hour, versus a non-REM AHI of 1.0/h. The patient spent 35 minutes of total sleep time in the supine position and 304 minutes in non-supine. The supine AHI was 5.1/h versus a non-supine AHI of 2.0/h.  OXYGEN SATURATION & C02:  The Wake baseline 02 saturation was 93%, with the lowest being 87%. Time spent below 89% saturation equaled 2 minutes.  The arousals were noted as: 28 were spontaneous, 0 were associated with PLMs, and 5 were associated with respiratory events. The patient had a total of 0 Periodic Limb Movements. She snored moderately loud in REM sleep. Audio and video analysis did not show any abnormal or unusual movements, behaviors, phonations or vocalizations.    The patient took one bathroom break. Snoring was noted in the last hour of sleep, during REM sleep.  EKG was in keeping with normal sinus  rhythm (NSR). Post-study, the patient indicated that sleep was the same as usual.    IMPRESSION:  1. Fragmented sleep with spontaneous arousals- no clinically significant degree of sleep apnea, snoring, hypoxia or PLMs. Arousals seem unrelated to any body function changes. Total sleep time of over 330 minutes.  2. Prolonged REM sleep latency is caused by medication, especially SSRI medication. It is the REM sleep cluster after 3.30 AM that the patient perceives as the most restorative and deep sleep.     RECOMMENDATIONS:  the Excessive Daytime Sleepiness remains unexplained. At this time the most likely  diagnosis is not narcolepsy - There are no associated sleep paralysis, dream intrusion or cataplexy symptoms.  I recommend HLA testing for Narcolepsy and only if positive, a follow up MSLT. A valid MSLT would need complete weaning of antidepressant medications and any sedatives or stimulants as well.     I certify that I have reviewed the entire raw data recording prior to the issuance of this report in accordance with the Standards of Accreditation of the American Academy of Sleep Medicine (AASM)      Larey Seat, MD   12-23-2018 Diplomat, American Board of Psychiatry and Neurology  Diplomat, American Board of [place] Director, [facility] [employer]

## 2018-12-23 NOTE — Addendum Note (Signed)
Addended by: Larey Seat on: 12/23/2018 05:42 PM   Modules accepted: Orders

## 2018-12-24 ENCOUNTER — Encounter: Payer: Self-pay | Admitting: Neurology

## 2018-12-24 ENCOUNTER — Telehealth: Payer: Self-pay | Admitting: Neurology

## 2018-12-24 NOTE — Telephone Encounter (Signed)
Called the patient and reviewed the sleep study with her. Reviewed the findings in detail with the patient. Pt verbalized understanding. Patient doesn't want to worry with narcolepsy testing at this time.

## 2018-12-24 NOTE — Telephone Encounter (Signed)
Called patient to discuss sleep study results. No answer at this time. LVM for the patient to call back.  Will also sent a mychart message

## 2018-12-24 NOTE — Telephone Encounter (Signed)
-----  Message from Larey Seat, MD sent at 12/23/2018  5:42 PM EDT ----- IMPRESSION:   1. Fragmented sleep with spontaneous arousals- no clinically  significant degree of sleep apnea, snoring, hypoxia or PLMs.  Arousals seem unrelated to any body function changes. Total sleep  time of over 330 minutes. This is commonly with depression and also with pain patients.  2. Prolonged REM sleep latency is caused by medication,  especially SSRI medication. It is the REM sleep cluster after  3.30 AM that the patient perceives as the most restorative and  deep sleep.     RECOMMENDATIONS: the Excessive Daytime Sleepiness remains  unexplained. At this time the most likely diagnosis is not  narcolepsy - There are no associated sleep paralysis, dream  intrusion or cataplexy symptoms.  I recommend HLA testing for Narcolepsy and only if positive, a  follow up MSLT. A valid MSLT would need complete weaning of  antidepressant medications and any sedatives or stimulants as  Well.   [place]

## 2018-12-24 NOTE — Telephone Encounter (Signed)
Pt has returned the call to RN Myriam Jacobson, she is asking for a call back

## 2019-03-23 DIAGNOSIS — F411 Generalized anxiety disorder: Secondary | ICD-10-CM | POA: Diagnosis not present

## 2019-03-25 DIAGNOSIS — F411 Generalized anxiety disorder: Secondary | ICD-10-CM | POA: Diagnosis not present

## 2019-03-25 DIAGNOSIS — F330 Major depressive disorder, recurrent, mild: Secondary | ICD-10-CM | POA: Diagnosis not present

## 2019-03-29 DIAGNOSIS — N3941 Urge incontinence: Secondary | ICD-10-CM | POA: Diagnosis not present

## 2019-03-29 DIAGNOSIS — N8189 Other female genital prolapse: Secondary | ICD-10-CM | POA: Diagnosis not present

## 2019-03-29 DIAGNOSIS — R351 Nocturia: Secondary | ICD-10-CM | POA: Diagnosis not present

## 2019-04-06 DIAGNOSIS — F411 Generalized anxiety disorder: Secondary | ICD-10-CM | POA: Diagnosis not present

## 2019-04-15 DIAGNOSIS — Z6829 Body mass index (BMI) 29.0-29.9, adult: Secondary | ICD-10-CM | POA: Diagnosis not present

## 2019-04-15 DIAGNOSIS — Z01419 Encounter for gynecological examination (general) (routine) without abnormal findings: Secondary | ICD-10-CM | POA: Diagnosis not present

## 2019-04-15 DIAGNOSIS — Z1231 Encounter for screening mammogram for malignant neoplasm of breast: Secondary | ICD-10-CM | POA: Diagnosis not present

## 2019-04-15 DIAGNOSIS — R32 Unspecified urinary incontinence: Secondary | ICD-10-CM | POA: Diagnosis not present

## 2019-04-15 DIAGNOSIS — E039 Hypothyroidism, unspecified: Secondary | ICD-10-CM | POA: Diagnosis not present

## 2019-04-30 ENCOUNTER — Other Ambulatory Visit: Payer: Self-pay

## 2019-04-30 DIAGNOSIS — Z20822 Contact with and (suspected) exposure to covid-19: Secondary | ICD-10-CM

## 2019-05-02 LAB — NOVEL CORONAVIRUS, NAA: SARS-CoV-2, NAA: NOT DETECTED

## 2019-05-04 DIAGNOSIS — F411 Generalized anxiety disorder: Secondary | ICD-10-CM | POA: Diagnosis not present

## 2019-05-13 DIAGNOSIS — F411 Generalized anxiety disorder: Secondary | ICD-10-CM | POA: Diagnosis not present

## 2019-05-13 DIAGNOSIS — F330 Major depressive disorder, recurrent, mild: Secondary | ICD-10-CM | POA: Diagnosis not present

## 2019-05-25 DIAGNOSIS — F411 Generalized anxiety disorder: Secondary | ICD-10-CM | POA: Diagnosis not present

## 2019-06-15 DIAGNOSIS — F411 Generalized anxiety disorder: Secondary | ICD-10-CM | POA: Diagnosis not present

## 2019-07-06 DIAGNOSIS — F411 Generalized anxiety disorder: Secondary | ICD-10-CM | POA: Diagnosis not present

## 2019-07-07 ENCOUNTER — Ambulatory Visit: Payer: BC Managed Care – PPO | Attending: Internal Medicine

## 2019-07-07 DIAGNOSIS — Z20822 Contact with and (suspected) exposure to covid-19: Secondary | ICD-10-CM

## 2019-07-09 LAB — NOVEL CORONAVIRUS, NAA: SARS-CoV-2, NAA: NOT DETECTED

## 2019-07-13 DIAGNOSIS — D2221 Melanocytic nevi of right ear and external auricular canal: Secondary | ICD-10-CM | POA: Diagnosis not present

## 2019-07-13 DIAGNOSIS — D2271 Melanocytic nevi of right lower limb, including hip: Secondary | ICD-10-CM | POA: Diagnosis not present

## 2019-07-13 DIAGNOSIS — D2262 Melanocytic nevi of left upper limb, including shoulder: Secondary | ICD-10-CM | POA: Diagnosis not present

## 2019-07-13 DIAGNOSIS — D2261 Melanocytic nevi of right upper limb, including shoulder: Secondary | ICD-10-CM | POA: Diagnosis not present

## 2019-07-15 DIAGNOSIS — F330 Major depressive disorder, recurrent, mild: Secondary | ICD-10-CM | POA: Diagnosis not present

## 2019-07-15 DIAGNOSIS — F411 Generalized anxiety disorder: Secondary | ICD-10-CM | POA: Diagnosis not present

## 2019-07-20 DIAGNOSIS — F411 Generalized anxiety disorder: Secondary | ICD-10-CM | POA: Diagnosis not present

## 2019-08-04 DIAGNOSIS — F411 Generalized anxiety disorder: Secondary | ICD-10-CM | POA: Diagnosis not present

## 2019-08-17 DIAGNOSIS — F411 Generalized anxiety disorder: Secondary | ICD-10-CM | POA: Diagnosis not present

## 2019-09-07 DIAGNOSIS — F411 Generalized anxiety disorder: Secondary | ICD-10-CM | POA: Diagnosis not present

## 2019-09-28 DIAGNOSIS — F411 Generalized anxiety disorder: Secondary | ICD-10-CM | POA: Diagnosis not present

## 2019-10-12 DIAGNOSIS — F411 Generalized anxiety disorder: Secondary | ICD-10-CM | POA: Diagnosis not present

## 2019-10-12 DIAGNOSIS — F330 Major depressive disorder, recurrent, mild: Secondary | ICD-10-CM | POA: Diagnosis not present

## 2019-10-15 ENCOUNTER — Ambulatory Visit: Payer: BC Managed Care – PPO | Admitting: Family Medicine

## 2019-10-26 DIAGNOSIS — F411 Generalized anxiety disorder: Secondary | ICD-10-CM | POA: Diagnosis not present

## 2019-10-29 DIAGNOSIS — F332 Major depressive disorder, recurrent severe without psychotic features: Secondary | ICD-10-CM | POA: Diagnosis not present

## 2019-10-29 DIAGNOSIS — F411 Generalized anxiety disorder: Secondary | ICD-10-CM | POA: Diagnosis not present

## 2019-11-26 DIAGNOSIS — F411 Generalized anxiety disorder: Secondary | ICD-10-CM | POA: Diagnosis not present

## 2019-11-26 DIAGNOSIS — F332 Major depressive disorder, recurrent severe without psychotic features: Secondary | ICD-10-CM | POA: Diagnosis not present

## 2019-11-29 DIAGNOSIS — F411 Generalized anxiety disorder: Secondary | ICD-10-CM | POA: Diagnosis not present

## 2019-12-24 DIAGNOSIS — F411 Generalized anxiety disorder: Secondary | ICD-10-CM | POA: Diagnosis not present

## 2020-01-21 DIAGNOSIS — F411 Generalized anxiety disorder: Secondary | ICD-10-CM | POA: Diagnosis not present

## 2020-01-21 DIAGNOSIS — F332 Major depressive disorder, recurrent severe without psychotic features: Secondary | ICD-10-CM | POA: Diagnosis not present

## 2020-02-02 DIAGNOSIS — F411 Generalized anxiety disorder: Secondary | ICD-10-CM | POA: Diagnosis not present

## 2020-03-08 DIAGNOSIS — F411 Generalized anxiety disorder: Secondary | ICD-10-CM | POA: Diagnosis not present

## 2020-04-05 DIAGNOSIS — F411 Generalized anxiety disorder: Secondary | ICD-10-CM | POA: Diagnosis not present

## 2020-04-19 DIAGNOSIS — F332 Major depressive disorder, recurrent severe without psychotic features: Secondary | ICD-10-CM | POA: Diagnosis not present

## 2020-04-19 DIAGNOSIS — F411 Generalized anxiety disorder: Secondary | ICD-10-CM | POA: Diagnosis not present

## 2020-04-26 DIAGNOSIS — F411 Generalized anxiety disorder: Secondary | ICD-10-CM | POA: Diagnosis not present

## 2020-05-16 DIAGNOSIS — F411 Generalized anxiety disorder: Secondary | ICD-10-CM | POA: Diagnosis not present

## 2020-06-13 DIAGNOSIS — F411 Generalized anxiety disorder: Secondary | ICD-10-CM | POA: Diagnosis not present

## 2020-07-11 DIAGNOSIS — F411 Generalized anxiety disorder: Secondary | ICD-10-CM | POA: Diagnosis not present

## 2020-07-17 DIAGNOSIS — F411 Generalized anxiety disorder: Secondary | ICD-10-CM | POA: Diagnosis not present

## 2020-07-17 DIAGNOSIS — F332 Major depressive disorder, recurrent severe without psychotic features: Secondary | ICD-10-CM | POA: Diagnosis not present

## 2020-07-17 DIAGNOSIS — E282 Polycystic ovarian syndrome: Secondary | ICD-10-CM | POA: Insufficient documentation

## 2020-07-17 DIAGNOSIS — E039 Hypothyroidism, unspecified: Secondary | ICD-10-CM | POA: Insufficient documentation

## 2020-07-18 DIAGNOSIS — Z124 Encounter for screening for malignant neoplasm of cervix: Secondary | ICD-10-CM | POA: Diagnosis not present

## 2020-07-18 DIAGNOSIS — N939 Abnormal uterine and vaginal bleeding, unspecified: Secondary | ICD-10-CM | POA: Diagnosis not present

## 2020-07-18 DIAGNOSIS — Z6828 Body mass index (BMI) 28.0-28.9, adult: Secondary | ICD-10-CM | POA: Diagnosis not present

## 2020-07-18 DIAGNOSIS — Z01419 Encounter for gynecological examination (general) (routine) without abnormal findings: Secondary | ICD-10-CM | POA: Diagnosis not present

## 2020-07-18 DIAGNOSIS — Z1231 Encounter for screening mammogram for malignant neoplasm of breast: Secondary | ICD-10-CM | POA: Diagnosis not present

## 2020-08-01 LAB — HM PAP SMEAR

## 2020-08-07 DIAGNOSIS — N939 Abnormal uterine and vaginal bleeding, unspecified: Secondary | ICD-10-CM | POA: Diagnosis not present

## 2020-08-08 DIAGNOSIS — F411 Generalized anxiety disorder: Secondary | ICD-10-CM | POA: Diagnosis not present

## 2020-09-13 DIAGNOSIS — D225 Melanocytic nevi of trunk: Secondary | ICD-10-CM | POA: Diagnosis not present

## 2020-09-13 DIAGNOSIS — D2271 Melanocytic nevi of right lower limb, including hip: Secondary | ICD-10-CM | POA: Diagnosis not present

## 2020-09-13 DIAGNOSIS — D2261 Melanocytic nevi of right upper limb, including shoulder: Secondary | ICD-10-CM | POA: Diagnosis not present

## 2020-09-13 DIAGNOSIS — D2262 Melanocytic nevi of left upper limb, including shoulder: Secondary | ICD-10-CM | POA: Diagnosis not present

## 2020-10-03 DIAGNOSIS — F411 Generalized anxiety disorder: Secondary | ICD-10-CM | POA: Diagnosis not present

## 2020-10-11 DIAGNOSIS — Z20822 Contact with and (suspected) exposure to covid-19: Secondary | ICD-10-CM | POA: Diagnosis not present

## 2020-10-11 DIAGNOSIS — F411 Generalized anxiety disorder: Secondary | ICD-10-CM | POA: Diagnosis not present

## 2020-10-11 DIAGNOSIS — F332 Major depressive disorder, recurrent severe without psychotic features: Secondary | ICD-10-CM | POA: Diagnosis not present

## 2020-10-11 DIAGNOSIS — R059 Cough, unspecified: Secondary | ICD-10-CM | POA: Diagnosis not present

## 2020-10-29 DIAGNOSIS — Z1211 Encounter for screening for malignant neoplasm of colon: Secondary | ICD-10-CM | POA: Diagnosis not present

## 2020-10-29 LAB — COLOGUARD: Cologuard: NEGATIVE

## 2020-11-05 LAB — COLOGUARD: COLOGUARD: NEGATIVE

## 2020-11-05 LAB — EXTERNAL GENERIC LAB PROCEDURE: COLOGUARD: NEGATIVE

## 2020-11-07 DIAGNOSIS — F411 Generalized anxiety disorder: Secondary | ICD-10-CM | POA: Diagnosis not present

## 2020-12-05 DIAGNOSIS — F411 Generalized anxiety disorder: Secondary | ICD-10-CM | POA: Diagnosis not present

## 2021-01-08 ENCOUNTER — Encounter (HOSPITAL_BASED_OUTPATIENT_CLINIC_OR_DEPARTMENT_OTHER): Payer: Self-pay | Admitting: Emergency Medicine

## 2021-01-08 ENCOUNTER — Emergency Department (HOSPITAL_BASED_OUTPATIENT_CLINIC_OR_DEPARTMENT_OTHER)
Admission: EM | Admit: 2021-01-08 | Discharge: 2021-01-08 | Disposition: A | Discharge status: Home / Self Care | Payer: No Typology Code available for payment source | Attending: Emergency Medicine | Admitting: Emergency Medicine

## 2021-01-08 ENCOUNTER — Emergency Department (HOSPITAL_BASED_OUTPATIENT_CLINIC_OR_DEPARTMENT_OTHER): Payer: No Typology Code available for payment source | Admitting: Radiology

## 2021-01-08 ENCOUNTER — Emergency Department (HOSPITAL_BASED_OUTPATIENT_CLINIC_OR_DEPARTMENT_OTHER): Payer: No Typology Code available for payment source

## 2021-01-08 ENCOUNTER — Other Ambulatory Visit: Payer: Self-pay

## 2021-01-08 DIAGNOSIS — R519 Headache, unspecified: Secondary | ICD-10-CM | POA: Diagnosis not present

## 2021-01-08 DIAGNOSIS — Y9301 Activity, walking, marching and hiking: Secondary | ICD-10-CM | POA: Diagnosis not present

## 2021-01-08 DIAGNOSIS — S199XXA Unspecified injury of neck, initial encounter: Secondary | ICD-10-CM | POA: Diagnosis not present

## 2021-01-08 DIAGNOSIS — R001 Bradycardia, unspecified: Secondary | ICD-10-CM | POA: Diagnosis not present

## 2021-01-08 DIAGNOSIS — J45909 Unspecified asthma, uncomplicated: Secondary | ICD-10-CM | POA: Diagnosis not present

## 2021-01-08 DIAGNOSIS — W010XXA Fall on same level from slipping, tripping and stumbling without subsequent striking against object, initial encounter: Secondary | ICD-10-CM | POA: Insufficient documentation

## 2021-01-08 DIAGNOSIS — E039 Hypothyroidism, unspecified: Secondary | ICD-10-CM | POA: Insufficient documentation

## 2021-01-08 DIAGNOSIS — R0902 Hypoxemia: Secondary | ICD-10-CM | POA: Diagnosis not present

## 2021-01-08 DIAGNOSIS — S52125A Nondisplaced fracture of head of left radius, initial encounter for closed fracture: Secondary | ICD-10-CM | POA: Diagnosis not present

## 2021-01-08 DIAGNOSIS — S59902A Unspecified injury of left elbow, initial encounter: Secondary | ICD-10-CM | POA: Diagnosis not present

## 2021-01-08 DIAGNOSIS — M25522 Pain in left elbow: Secondary | ICD-10-CM | POA: Diagnosis not present

## 2021-01-08 DIAGNOSIS — R609 Edema, unspecified: Secondary | ICD-10-CM | POA: Diagnosis not present

## 2021-01-08 DIAGNOSIS — R55 Syncope and collapse: Secondary | ICD-10-CM | POA: Diagnosis not present

## 2021-01-08 DIAGNOSIS — R9431 Abnormal electrocardiogram [ECG] [EKG]: Secondary | ICD-10-CM | POA: Diagnosis not present

## 2021-01-08 HISTORY — PX: ELBOW FRACTURE SURGERY: SHX616

## 2021-01-08 MED ORDER — HYDROCODONE-ACETAMINOPHEN 5-325 MG PO TABS: 1.0000 | ORAL_TABLET | Freq: Four times a day (QID) | ORAL | 0 refills | Status: DC | PRN

## 2021-01-08 MED ORDER — NAPROXEN 375 MG PO TABS: 375.0000 mg | ORAL_TABLET | Freq: Two times a day (BID) | ORAL | 0 refills | Status: DC

## 2021-01-08 MED ORDER — OXYCODONE-ACETAMINOPHEN 5-325 MG PO TABS
1.0000 | ORAL_TABLET | Freq: Once | ORAL | Status: AC
  Administered 2021-01-08: 1 via ORAL
  Filled 2021-01-08: qty 1

## 2021-01-08 NOTE — Discharge Instructions (Addendum)
Take the medications as needed for pain.  Try applying ice to help with swelling.  Keep the sling and splint on.  Follow-up with an orthopedic doctor for further evaluation

## 2021-01-08 NOTE — ED Notes (Signed)
Patient verbalizes understanding of discharge instructions. Opportunity for questioning and answers were provided. Patient discharged from ED.  °

## 2021-01-08 NOTE — ED Notes (Signed)
Patient transported to CT and X ray

## 2021-01-08 NOTE — ED Provider Notes (Signed)
[facility] Provider Note   CSN: [id number] Arrival date & time: 01/08/21  0948     History Chief Complaint  Patient presents with   Lytle Michaels    Shelly Lewis is a 45 y.o. female.   Fall   Patient presented to the ED for evaluation after fall.  Patient was walking when she ended up tripping and falling onto her outstretched arm.  Patient states she does not think she hit her head but she was unsteady when she tried to get up and walk.  She also ended up having a brief syncopal episode.  Patient's primary pain is in her left elbow.  She states it is very painful whenever she tries to move her elbow at all.  She is not having any wrist tenderness.  No shoulder pain.  No difficulty breathing.  No chest pain or shortness of breath.  No injury to her right arm or her lower extremities  Past Medical History:  Diagnosis Date   Allergy    Anxiety    Asthma    Blood transfusion without reported diagnosis    Dizziness    Family history of premature coronary artery disease 08/22/2015   GERD (gastroesophageal reflux disease)    Headache    Hyperlipidemia 08/22/2015   IBS (irritable bowel syndrome)    Thyroid disease    Hypothyroidism    Patient Active Problem List   Diagnosis Date Noted   Excessive daytime sleepiness 12/23/2018   Non-restorative sleep 12/23/2018   Loud snoring 12/23/2018   Restless sleeper 12/23/2018   Family history of premature coronary artery disease 08/22/2015   Hyperlipidemia 08/22/2015   GAD (generalized anxiety disorder) 01/02/2014   LACTOSE INTOLERANCE 03/08/2009   ESOPHAGEAL REFLUX 03/08/2009   IBS 03/08/2009    Past Surgical History:  Procedure Laterality Date   ECTOPIC PREGNANCY SURGERY     MIDDLE EAR SURGERY     TONSILLECTOMY       OB History   No obstetric history on file.     Family History  Problem Relation Age of Onset   Hyperlipidemia Mother    Depression Mother    Diabetes Father    Hyperlipidemia  Father    Cancer Maternal Grandmother    Heart disease Maternal Grandmother    Heart disease Paternal Grandmother    Hyperlipidemia Paternal Grandfather    Stroke Paternal Grandfather     Social History   Tobacco Use   Smoking status: Never   Smokeless tobacco: Never  Substance Use Topics   Alcohol use: Yes    Alcohol/week: 4.0 standard drinks    Types: 4 Glasses of wine per week    Comment: at dinner   Drug use: No    Home Medications Prior to Admission medications   Medication Sig Start Date End Date Taking? Authorizing Provider  busPIRone (BUSPAR) 10 MG tablet Take 10 mg by mouth once.   Yes [provider]  escitalopram (LEXAPRO) 5 MG tablet Take 2 tablets (10 mg total) by mouth daily. PATIENT NEEDS OFFICE VISIT FOR ADDITIONAL REFILLS 01/16/15  Yes [name], MD  HYDROcodone-acetaminophen (NORCO/VICODIN) 5-325 MG tablet Take 1 tablet by mouth every 6 (six) hours as needed for severe pain. 01/08/21  Yes Dorie Rank, MD  naproxen (NAPROSYN) 375 MG tablet Take 1 tablet (375 mg total) by mouth 2 (two) times daily. 01/08/21  Yes Dorie Rank, MD  ARMOUR THYROID 30 MG tablet Take 2 tablets by mouth every morning. 07/25/15   [provider]    Allergies    Mold extract [trichophyton], Gluten meal, and Lac bovis  Review of Systems   Review of Systems  All other systems reviewed and are negative.  Physical Exam Updated Vital Signs BP (!) 97/59   Pulse 76   Temp 98.7 F (37.1 C)   Resp 13   Ht 1.549 m (5' 1" )   Wt 68 kg   LMP 01/03/2021 (Approximate)   SpO2 93%   BMI 28.34 kg/m   Physical Exam Vitals and nursing note reviewed.  Constitutional:      General: She is not in acute distress.    Appearance: She is well-developed.  HENT:     Head: Normocephalic and atraumatic.     Right Ear: External ear normal.     Left Ear: External ear normal.  Eyes:     General: No scleral icterus.       Right eye: No discharge.        Left eye: No discharge.      Conjunctiva/sclera: Conjunctivae normal.  Neck:     Trachea: No tracheal deviation.  Cardiovascular:     Rate and Rhythm: Normal rate and regular rhythm.  Pulmonary:     Effort: Pulmonary effort is normal. No respiratory distress.     Breath sounds: Normal breath sounds. No stridor.  Abdominal:     General: There is no distension.  Musculoskeletal:        General: Tenderness present. No swelling or deformity.     Right shoulder: No swelling, tenderness or bony tenderness.     Left shoulder: No swelling, tenderness or bony tenderness.     Left elbow: No swelling. Decreased range of motion. Tenderness present.     Right wrist: No swelling, tenderness or bony tenderness.     Left wrist: No swelling, tenderness or bony tenderness.     Cervical back: Neck supple. No swelling, tenderness or bony tenderness.     Thoracic back: No swelling, tenderness or bony tenderness.     Lumbar back: No swelling, tenderness or bony tenderness.     Right hip: No tenderness or bony tenderness. Normal range of motion.     Left hip: No tenderness or bony tenderness. Normal range of motion.     Right ankle: No swelling. No tenderness.     Left ankle: No swelling. No tenderness.  Skin:    General: Skin is warm and dry.     Findings: No rash.  Neurological:     Mental Status: She is alert.     Cranial Nerves: Cranial nerve deficit: no gross deficits.    ED Results / Procedures / Treatments   Labs (all labs ordered are listed, but only abnormal results are displayed) Labs Reviewed - No data to display  EKG EKG Interpretation  Date/Time:  Monday January 08 2021 09:53:36 EDT Ventricular Rate:  81 PR Interval:  158 QRS Duration: 98 QT Interval:  398 QTC Calculation: 462 R Axis:   21 Text Interpretation: Sinus rhythm Low voltage, precordial leads No significant change since last tracing Confirmed by Dorie Rank [phone]) on 01/08/2021 9:57:28 AM  Radiology DG Elbow 2 Views Left  Result Date:  01/08/2021 CLINICAL DATA:  Fall, pain EXAM: LEFT ELBOW - 2 VIEW COMPARISON:  For series today FINDINGS: Study is limited due to patient's inability to move the arm. Lateral view is suboptimal. There appears to be a joint effusion. On the forearm series, there appears to be a radial head/neck fracture,  not well visualized on the these limited elbow views. IMPRESSION: Limited study due to positioning. There appears to be a joint effusion and when comparing to forearm series, likely radial head/neck fracture. Electronically Signed   By: Rolm Baptise M.D.   On: 01/08/2021 10:55   DG Forearm Left  Result Date: 01/08/2021 CLINICAL DATA:  Fall, left arm pain EXAM: LEFT FOREARM - 2 VIEW COMPARISON:  Elbow series today FINDINGS: Limited views. Patient unable to move arm for optimal positioning. There appears to be a fracture through the radial head/neck region. No additional forearm abnormality. IMPRESSION: Nondisplaced radial head/neck fracture. Electronically Signed   By: Rolm Baptise M.D.   On: 01/08/2021 10:56   CT Head Wo Contrast  Result Date: 01/08/2021 CLINICAL DATA:  Neck trauma EXAM: CT HEAD WITHOUT CONTRAST CT CERVICAL SPINE WITHOUT CONTRAST TECHNIQUE: Multidetector CT imaging of the head and cervical spine was performed following the standard protocol without intravenous contrast. Multiplanar CT image reconstructions of the cervical spine were also generated. COMPARISON:  None. FINDINGS: CT HEAD FINDINGS Brain: No evidence of acute infarction, hemorrhage, hydrocephalus, extra-axial collection or mass lesion/mass effect. Vascular: No hyperdense vessel or unexpected calcification. Skull: Normal. Negative for fracture or focal lesion. Sinuses/Orbits: No acute finding. Other: None. CT CERVICAL SPINE FINDINGS Alignment: Normal. Skull base and vertebrae: No acute fracture. No primary bone lesion or focal pathologic process. Soft tissues and spinal canal: No prevertebral fluid or swelling. No visible canal  hematoma. Disc levels: Mild multilevel degenerative disc disease, most pronounced from C4-C7. Upper chest: Negative. Other: None. IMPRESSION: No acute intracranial abnormalities. No acute osseous findings the cervical spine. Electronically Signed   By: Yetta Glassman MD   On: 01/08/2021 11:09   CT Cervical Spine Wo Contrast  Result Date: 01/08/2021 CLINICAL DATA:  Neck trauma EXAM: CT HEAD WITHOUT CONTRAST CT CERVICAL SPINE WITHOUT CONTRAST TECHNIQUE: Multidetector CT imaging of the head and cervical spine was performed following the standard protocol without intravenous contrast. Multiplanar CT image reconstructions of the cervical spine were also generated. COMPARISON:  None. FINDINGS: CT HEAD FINDINGS Brain: No evidence of acute infarction, hemorrhage, hydrocephalus, extra-axial collection or mass lesion/mass effect. Vascular: No hyperdense vessel or unexpected calcification. Skull: Normal. Negative for fracture or focal lesion. Sinuses/Orbits: No acute finding. Other: None. CT CERVICAL SPINE FINDINGS Alignment: Normal. Skull base and vertebrae: No acute fracture. No primary bone lesion or focal pathologic process. Soft tissues and spinal canal: No prevertebral fluid or swelling. No visible canal hematoma. Disc levels: Mild multilevel degenerative disc disease, most pronounced from C4-C7. Upper chest: Negative. Other: None. IMPRESSION: No acute intracranial abnormalities. No acute osseous findings the cervical spine. Electronically Signed   By: Yetta Glassman MD   On: 01/08/2021 11:09    Procedures Procedures   Medications Ordered in ED Medications - No data to display  ED Course  I have reviewed the triage vital signs and the nursing notes.  Pertinent labs & imaging results that were available during my care of the patient were reviewed by me and considered in my medical decision making (see chart for details).  Clinical Course as of 01/08/21 1129  Mon Jan 08, 2021  1112 Head and C-spine CT  without acute findings [JK]    Clinical Course User Index [JK] Dorie Rank, MD   MDM Rules/Calculators/A&P                           Patient presented to the ED for  evaluation after a fall.  Patient's pain is primarily in her elbow.  She did have a syncopal episode after the event but I do think that is most likely vasovagal in response to her pain.  No signs of head or C-spine injury.  X-ray does show findings consistent with a radial head fracture.  We will place in a sling and shoulder immobilizer.  Outpatient follow-up with orthopedics. Final Clinical Impression(s) / ED Diagnoses Final diagnoses:  Closed nondisplaced fracture of head of left radius, initial encounter    Rx / DC Orders ED Discharge Orders          Ordered    HYDROcodone-acetaminophen (NORCO/VICODIN) 5-325 MG tablet  Every 6 hours PRN        01/08/21 1128    naproxen (NAPROSYN) 375 MG tablet  2 times daily        01/08/21 1128             Dorie Rank, MD 01/08/21 1129

## 2021-01-08 NOTE — ED Triage Notes (Signed)
Pt arrived via EMS. At approx 0830 Pt had a mechanical fall while walking. Pt braced her fall with her left wrist. EMS reported that the Pt did report having a brief syncopal episode upon their arrival. EMS stated that  they thought it was a vagal response.   EMS gave 50 mcg of fentanyl on scene.  Pt also complains of  bilateral shoulders pain

## 2021-01-08 NOTE — ED Triage Notes (Signed)
EMS did stated that Pt's drop to 90% after giving med but returned to normal 94% after giving 2 L. Pt is currently room air and at 95%. Pt also given 40 mL of NS.  EMS vital CBG 141. 118/76, hr 80,

## 2021-01-12 DIAGNOSIS — F411 Generalized anxiety disorder: Secondary | ICD-10-CM | POA: Diagnosis not present

## 2021-01-16 DIAGNOSIS — S42492A Other displaced fracture of lower end of left humerus, initial encounter for closed fracture: Secondary | ICD-10-CM | POA: Diagnosis not present

## 2021-01-18 DIAGNOSIS — F411 Generalized anxiety disorder: Secondary | ICD-10-CM | POA: Diagnosis not present

## 2021-01-18 DIAGNOSIS — F332 Major depressive disorder, recurrent severe without psychotic features: Secondary | ICD-10-CM | POA: Diagnosis not present

## 2021-01-19 DIAGNOSIS — S42492A Other displaced fracture of lower end of left humerus, initial encounter for closed fracture: Secondary | ICD-10-CM | POA: Diagnosis not present

## 2021-01-19 DIAGNOSIS — S42452A Displaced fracture of lateral condyle of left humerus, initial encounter for closed fracture: Secondary | ICD-10-CM | POA: Diagnosis not present

## 2021-01-19 DIAGNOSIS — X58XXXA Exposure to other specified factors, initial encounter: Secondary | ICD-10-CM | POA: Diagnosis not present

## 2021-01-19 DIAGNOSIS — G8918 Other acute postprocedural pain: Secondary | ICD-10-CM | POA: Diagnosis not present

## 2021-01-19 DIAGNOSIS — Y999 Unspecified external cause status: Secondary | ICD-10-CM | POA: Diagnosis not present

## 2021-02-01 DIAGNOSIS — S42492A Other displaced fracture of lower end of left humerus, initial encounter for closed fracture: Secondary | ICD-10-CM | POA: Diagnosis not present

## 2021-02-16 DIAGNOSIS — F411 Generalized anxiety disorder: Secondary | ICD-10-CM | POA: Diagnosis not present

## 2021-02-23 ENCOUNTER — Ambulatory Visit: Payer: BC Managed Care – PPO | Admitting: Physician Assistant

## 2021-02-23 ENCOUNTER — Encounter: Payer: Self-pay | Admitting: Physician Assistant

## 2021-02-23 ENCOUNTER — Other Ambulatory Visit: Payer: Self-pay

## 2021-02-23 VITALS — BP 113/82 | HR 84 | Temp 97.5°F | Ht 61.0 in | Wt 160.0 lb

## 2021-02-23 DIAGNOSIS — R7303 Prediabetes: Secondary | ICD-10-CM

## 2021-02-23 DIAGNOSIS — R5383 Other fatigue: Secondary | ICD-10-CM | POA: Diagnosis not present

## 2021-02-23 DIAGNOSIS — E0789 Other specified disorders of thyroid: Secondary | ICD-10-CM

## 2021-02-23 DIAGNOSIS — R42 Dizziness and giddiness: Secondary | ICD-10-CM

## 2021-02-23 DIAGNOSIS — F32A Depression, unspecified: Secondary | ICD-10-CM

## 2021-02-23 DIAGNOSIS — Z23 Encounter for immunization: Secondary | ICD-10-CM

## 2021-02-23 DIAGNOSIS — E782 Mixed hyperlipidemia: Secondary | ICD-10-CM

## 2021-02-23 DIAGNOSIS — F419 Anxiety disorder, unspecified: Secondary | ICD-10-CM

## 2021-02-23 LAB — COMPREHENSIVE METABOLIC PANEL
ALT: 14 U/L (ref 0–35)
AST: 14 U/L (ref 0–37)
Albumin: 4.3 g/dL (ref 3.5–5.2)
Alkaline Phosphatase: 52 U/L (ref 39–117)
BUN: 13 mg/dL (ref 6–23)
CO2: 24 mEq/L (ref 19–32)
Calcium: 9.2 mg/dL (ref 8.4–10.5)
Chloride: 103 mEq/L (ref 96–112)
Creatinine, Ser: 0.79 mg/dL (ref 0.40–1.20)
GFR: 89.12 mL/min (ref >60.00)
Glucose, Bld: 133 mg/dL — ABNORMAL HIGH (ref 70–99)
Potassium: 4.2 mEq/L (ref 3.5–5.1)
Sodium: 137 mEq/L (ref 135–145)
Total Bilirubin: 0.4 mg/dL (ref 0.2–1.2)
Total Protein: 7 g/dL (ref 6.0–8.3)

## 2021-02-23 LAB — CBC WITH DIFFERENTIAL/PLATELET
Basophils Absolute: 0 10*3/uL (ref 0.0–0.1)
Basophils Relative: 0.8 % (ref 0.0–3.0)
Eosinophils Absolute: 0.2 10*3/uL (ref 0.0–0.7)
Eosinophils Relative: 3 % (ref 0.0–5.0)
HCT: 39.8 % (ref 36.0–46.0)
Hemoglobin: 13.2 g/dL (ref 12.0–15.0)
Lymphocytes Relative: 28.6 % (ref 12.0–46.0)
Lymphs Abs: 1.7 10*3/uL (ref 0.7–4.0)
MCHC: 33.2 g/dL (ref 30.0–36.0)
MCV: 86.4 fl (ref 78.0–100.0)
Monocytes Absolute: 0.4 10*3/uL (ref 0.1–1.0)
Monocytes Relative: 6.6 % (ref 3.0–12.0)
Neutro Abs: 3.7 10*3/uL (ref 1.4–7.7)
Neutrophils Relative %: 61 % (ref 43.0–77.0)
Platelets: 316 10*3/uL (ref 150.0–400.0)
RBC: 4.61 Mil/uL (ref 3.87–5.11)
RDW: 13 % (ref 11.5–15.5)
WBC: 6 10*3/uL (ref 4.0–10.5)

## 2021-02-23 LAB — LIPID PANEL
Cholesterol: 256 mg/dL — ABNORMAL HIGH (ref 0–200)
HDL: 47.4 mg/dL (ref >39.00)
LDL Cholesterol: 188 mg/dL — ABNORMAL HIGH (ref 0–99)
NonHDL: 208.97
Total CHOL/HDL Ratio: 5
Triglycerides: 104 mg/dL (ref 0.0–149.0)
VLDL: 20.8 mg/dL (ref 0.0–40.0)

## 2021-02-23 LAB — VITAMIN D 25 HYDROXY (VIT D DEFICIENCY, FRACTURES): VITD: 44.78 ng/mL (ref 30.00–100.00)

## 2021-02-23 LAB — TSH: TSH: 1.59 u[IU]/mL (ref 0.35–5.50)

## 2021-02-23 LAB — HEMOGLOBIN A1C: Hgb A1c MFr Bld: 7 % — ABNORMAL HIGH (ref 4.6–6.5)

## 2021-02-23 LAB — T4, FREE: Free T4: 0.66 ng/dL (ref 0.60–1.60)

## 2021-02-23 LAB — VITAMIN B12: Vitamin B-12: 441 pg/mL (ref 211–911)

## 2021-02-23 NOTE — Patient Instructions (Addendum)
Good to meet you today! Please go to the lab for blood work and I will send results through [facility]. Referral to neurology for your dizzy / lightheaded episodes. Regular f/up with eye doctor. Call sooner if any concerns.
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
## 2021-02-28 ENCOUNTER — Telehealth (INDEPENDENT_AMBULATORY_CARE_PROVIDER_SITE_OTHER): Payer: BC Managed Care – PPO | Admitting: Physician Assistant

## 2021-02-28 DIAGNOSIS — E782 Mixed hyperlipidemia: Secondary | ICD-10-CM

## 2021-02-28 DIAGNOSIS — E1165 Type 2 diabetes mellitus with hyperglycemia: Secondary | ICD-10-CM

## 2021-02-28 MED ORDER — METFORMIN HCL ER 500 MG PO TB24: 500.0000 mg | ORAL_TABLET | Freq: Every evening | ORAL | 2 refills | Status: DC

## 2021-02-28 NOTE — Progress Notes (Signed)
Virtual Visit via Video Note  I connected with  Shelly Lewis  on 02/28/21 at  1:00 PM EDT by a video enabled telemedicine application and verified that I am speaking with the correct person using two identifiers.  Location: Patient: home Provider: Therapist, music at [facility] present: Patient and myself   I discussed the limitations of evaluation and management by telemedicine and the availability of in person appointments. The patient expressed understanding and agreed to proceed.   History of Present Illness:  45 year old pleasant patient presents for virtual visit with me to discuss her recent lab results.  On 02/23/2021 her hemoglobin A1c was 7.0.  Her lipid panel showed that her cholesterol total level was at 256 and her LDL was 188.  Her CMP was normal except that her sugar level was 133.  The remainder of her labs including CBC, B12, vitamin D, and thyroid panel were all normal.  Gestational diabetes twice in her life. Father with hx of diabetes. She was doing a lot of walking prior to her elbow fracture in Aug 2022. Since then not as much and she hasn't been as careful about her diet.   She has a report with her that shows me Aug 2020 labs:  Total cholesterol: 294  Ha1c 6.1    Observations/Objective:   Gen: Awake, alert, no acute distress Resp: Breathing is even and non-labored Psych: calm/pleasant demeanor Neuro: Alert and Oriented x 3, + facial symmetry, speech is clear.   Assessment and Plan:  1. Type 2 diabetes mellitus with hyperglycemia, without long-term current use of insulin (HCC) Discussion with patient today about options for her newly diagnosed diabetes.  She is very interested in lifestyle control and I agree that she has a good chance of being successful with this.  Referral placed for nutritionist.  We also discussed trying metformin to help with some glucose control and she is agreeable.  Side effects discussed.  She will start this  at once a day and we can see how she is doing in the next 3 months.  2. Mixed hyperlipidemia Again, we are going to try lifestyle changes to see if we can help bring this cholesterol level down.  Talked about whole grains, less processed foods, more fruit and vegetables, and less red meat.   Follow Up Instructions:    I discussed the assessment and treatment plan with the patient. The patient was provided an opportunity to ask questions and all were answered. The patient agreed with the plan and demonstrated an understanding of the instructions.   The patient was advised to call back or seek an in-person evaluation if the symptoms worsen or if the condition fails to improve as anticipated.  Karrah Mangini M Tyjanae Bartek, PA-C

## 2021-03-13 DIAGNOSIS — F332 Major depressive disorder, recurrent severe without psychotic features: Secondary | ICD-10-CM | POA: Diagnosis not present

## 2021-03-13 DIAGNOSIS — F411 Generalized anxiety disorder: Secondary | ICD-10-CM | POA: Diagnosis not present

## 2021-03-21 DIAGNOSIS — F411 Generalized anxiety disorder: Secondary | ICD-10-CM | POA: Diagnosis not present

## 2021-04-27 DIAGNOSIS — F411 Generalized anxiety disorder: Secondary | ICD-10-CM | POA: Diagnosis not present

## 2021-04-30 ENCOUNTER — Encounter: Payer: Self-pay | Admitting: Dietician

## 2021-04-30 ENCOUNTER — Other Ambulatory Visit: Payer: Self-pay

## 2021-04-30 ENCOUNTER — Encounter: Payer: BC Managed Care – PPO | Attending: Physician Assistant | Admitting: Dietician

## 2021-04-30 VITALS — Ht 62.0 in | Wt 164.1 lb

## 2021-04-30 DIAGNOSIS — E119 Type 2 diabetes mellitus without complications: Secondary | ICD-10-CM | POA: Insufficient documentation

## 2021-04-30 NOTE — Progress Notes (Signed)
Diabetes Self-Management Education  Visit Type: First/Initial  Appt. Start Time: 1600 Appt. End Time: 1710  04/30/2021  [name], identified by name and date of birth, is a 45 y.o. female with a diagnosis of Diabetes: Type 2.   ASSESSMENT Pt reports hx of gestational diabetes, had diabetes well controlled during both pregnancies. Pt is taking metformin once daily, no GI side effects. Pt is interested in dietary and lifestyle changes to lower A1c. Pt wants to check their BG, RDN provided sample meter and checked pt CBG during visit. CBG - 145, pt ate lunch about 2 hours before appointment. Sample Meter: ContourNext EZ Lot: [id number] Exp Date: 05/09/2021 Pt reports an intolerance to dairy and gluten. Pt reports developing reflux due to gluten sensitivity that led to asthma and migraines, as well as diarrhea. Pt reports a moderate stress level, practices different forms of stress reduction including massages, and working with a Social worker. Pt reports stress eating as well, tends to go for carbs/sweets. Pt reports not having a good relationship with food, specifically carbs. Pt reports bouts of lightheadedness over the summer that led to a fall and a broken elbow.  Pt reports trying to get 5,000 - 10,000 steps a day. Pt has tried fitness classes in the past, but hasn't found one they really enjoy. Pt reports enjoying lifting weights for physical therapy in the past, would be interested in including some in the future.  Height 5' 2"  (1.575 m), weight 164 lb 1.6 oz (74.4 kg). Body mass index is 30.01 kg/m.   Diabetes Self-Management Education - 04/30/21 1631       Visit Information   Visit Type First/Initial      Initial Visit   Diabetes Type Type 2    Are you currently following a meal plan? No   Avoiding gluten and dairy   Are you taking your medications as prescribed? Yes    Date Diagnosed 02/23/2021      Health Coping   How would you rate your overall health? Poor       Psychosocial Assessment   Patient Belief/Attitude about Diabetes Motivated to manage diabetes    Self-care barriers None    Self-management support Doctor's office;Family    Other persons present Patient    Patient Concerns Nutrition/Meal planning;Healthy Lifestyle;Problem Solving    Special Needs None    Preferred Learning Style No preference indicated    Learning Readiness Ready    How often do you need to have someone help you when you read instructions, pamphlets, or other written materials from your doctor or pharmacy? 1 - Never    What is the last grade level you completed in school? graduate degree      Pre-Education Assessment   Patient understands the diabetes disease and treatment process. Needs Review    Patient understands incorporating nutritional management into lifestyle. Needs Review    Patient undertands incorporating physical activity into lifestyle. Needs Review    Patient understands using medications safely. Needs Review    Patient understands monitoring blood glucose, interpreting and using results Needs Review    Patient understands prevention, detection, and treatment of acute complications. Needs Review    Patient understands prevention, detection, and treatment of chronic complications. Needs Review    Patient understands how to develop strategies to address psychosocial issues. Needs Review    Patient understands how to develop strategies to promote health/change behavior. Needs Review      Complications   Last HgB A1C per patient/outside source  7 %   02/23/2021   How often do you check your blood sugar? 0 times/day (not testing)    Postprandial Blood glucose range (mg/dL) 130-179   RD checked CBG during visit - 145   Have you had a dilated eye exam in the past 12 months? Yes    Have you had a dental exam in the past 12 months? Yes      Dietary Intake   Breakfast Oatmeal, black coffee, water    Lunch Gluten free wrap, ham, hummus    Snack (afternoon) [employer] with cocunut milk    Dinner Chicken breast, onion, squash, broccoli    Snack (evening) Snack bar    Beverage(s) coffee, water      Exercise   Exercise Type ADL's;Light (walking / raking leaves)    How many days per week to you exercise? 0    How many minutes per day do you exercise? 0    Total minutes per week of exercise 0      Patient Education   Previous Diabetes Education Yes (please comment)   GDM 17 years ago   Disease state  Factors that contribute to the development of diabetes;Explored patient's options for treatment of their diabetes    Nutrition management  Carbohydrate counting;Food label reading, portion sizes and measuring food.;Role of diet in the treatment of diabetes and the relationship between the three main macronutrients and blood glucose level;Reviewed blood glucose goals for pre and post meals and how to evaluate the patients' food intake on their blood glucose level.    Physical activity and exercise  Role of exercise on diabetes management, blood pressure control and cardiac health.;Helped patient identify appropriate exercises in relation to his/her diabetes, diabetes complications and other health issue.    Medications Reviewed patients medication for diabetes, action, purpose, timing of dose and side effects.    Monitoring Taught/evaluated SMBG meter.;Purpose and frequency of SMBG.;Identified appropriate SMBG and/or A1C goals.    Acute complications Taught treatment of hypoglycemia - the 15 rule.    Chronic complications Relationship between chronic complications and blood glucose control;Retinopathy and reason for yearly dilated eye exams    Psychosocial adjustment Role of stress on diabetes    Personal strategies to promote health Lifestyle issues that need to be addressed for better diabetes care   Carb restricting     Individualized Goals (developed by patient)   Nutrition Follow meal plan discussed    Physical Activity Exercise 3-5 times per week     Medications take my medication as prescribed    Monitoring  test my blood glucose as discussed      Post-Education Assessment   Patient understands the diabetes disease and treatment process. Needs Review    Patient understands incorporating nutritional management into lifestyle. Needs Review    Patient undertands incorporating physical activity into lifestyle. Needs Review    Patient understands using medications safely. Needs Review    Patient understands monitoring blood glucose, interpreting and using results Needs Review    Patient understands prevention, detection, and treatment of acute complications. Needs Review    Patient understands prevention, detection, and treatment of chronic complications. Needs Review    Patient understands how to develop strategies to address psychosocial issues. Needs Review    Patient understands how to develop strategies to promote health/change behavior. Needs Review      Outcomes   Expected Outcomes Demonstrated interest in learning. Expect positive outcomes    Future DMSE 2 months  Program Status Not Completed             Individualized Plan for Diabetes Self-Management Training:   Learning Objective:  Patient will have a greater understanding of diabetes self-management. Patient education plan is to attend individual and/or group sessions per assessed needs and concerns.   Plan:   Patient Instructions  Work towards eating three meals and two snacks a day, about 2-3 hours apart!  Begin to recognize carbohydrates in your food choices!  Have 2 carb choices at each meal (30 g), and 1 carb choice with each snack (15 g)  Begin to build your meals using the proportions of the Balanced Plate. First, select your carb choice(s) for the meal, and determine how much you should have to equal 2 carb choices (30 g). Next, select your source of protein to pair with your carb choice(s). Finally, complete the remaining half of your meal with a  variety of non-starchy vegetables.  Do resistance exercises 3 nights a week for 10-15 minutes!  Check your blood sugar each morning before eating or drinking (fasting). Look for numbers between 70-100 mg/dL Check your blood sugar 2 hours after you begin eating a meal. Look for numbers under 140 mg/dL.      Expected Outcomes:  Demonstrated interest in learning. Expect positive outcomes  Education material provided: ADA - How to Thrive: A Guide for Your Journey with Diabetes, My Plate, and Snack sheet  If problems or questions, patient to contact team via:  Phone and Email  Future DSME appointment: 2 months

## 2021-04-30 NOTE — Patient Instructions (Signed)
Work towards eating three meals and two snacks a day, about 2-3 hours apart!  Begin to recognize carbohydrates in your food choices!  Have 2 carb choices at each meal (30 g), and 1 carb choice with each snack (15 g)  Begin to build your meals using the proportions of the Balanced Plate. First, select your carb choice(s) for the meal, and determine how much you should have to equal 2 carb choices (30 g). Next, select your source of protein to pair with your carb choice(s). Finally, complete the remaining half of your meal with a variety of non-starchy vegetables.  Do resistance exercises 3 nights a week for 10-15 minutes!  Check your blood sugar each morning before eating or drinking (fasting). Look for numbers between 70-100 mg/dL Check your blood sugar 2 hours after you begin eating a meal. Look for numbers under 140 mg/dL.

## 2021-05-01 ENCOUNTER — Other Ambulatory Visit: Payer: Self-pay

## 2021-05-01 ENCOUNTER — Telehealth: Payer: Self-pay

## 2021-05-01 MED ORDER — BLOOD GLUCOSE METER KIT: PACK | 0 refills | Status: AC

## 2021-05-01 NOTE — Telephone Encounter (Signed)
Pt called and stated that she saw the Dietitian yesterday and was told to request a glucose meter. Pt stated that she needs a prescription for Contour Next Blood Glucose Monitor. Can the prescription be written? Please Advise.

## 2021-05-02 NOTE — Telephone Encounter (Signed)
Rx sent to the pharmacy.

## 2021-05-07 ENCOUNTER — Other Ambulatory Visit: Payer: Self-pay | Admitting: *Deleted

## 2021-05-07 MED ORDER — GLUCOSE BLOOD VI STRP: ORAL_STRIP | 3 refills | Status: DC

## 2021-05-07 MED ORDER — LANCETS 33G MISC: 3 refills | Status: AC

## 2021-05-07 NOTE — Telephone Encounter (Signed)
Rx sent to the pharmacy.

## 2021-05-07 NOTE — Telephone Encounter (Signed)
Patient needs Lancets and test strips not the monitor.

## 2021-05-25 ENCOUNTER — Other Ambulatory Visit: Payer: Self-pay | Admitting: Physician Assistant

## 2021-05-28 ENCOUNTER — Ambulatory Visit: Payer: BC Managed Care – PPO | Admitting: Neurology

## 2021-05-28 ENCOUNTER — Encounter: Payer: Self-pay | Admitting: Neurology

## 2021-05-28 ENCOUNTER — Other Ambulatory Visit: Payer: Self-pay

## 2021-05-28 VITALS — Ht 62.0 in | Wt 161.0 lb

## 2021-05-28 DIAGNOSIS — J3089 Other allergic rhinitis: Secondary | ICD-10-CM | POA: Diagnosis not present

## 2021-05-28 DIAGNOSIS — H539 Unspecified visual disturbance: Secondary | ICD-10-CM

## 2021-05-28 DIAGNOSIS — R42 Dizziness and giddiness: Secondary | ICD-10-CM | POA: Diagnosis not present

## 2021-05-28 MED ORDER — TOPIRAMATE 25 MG PO TABS: 25.0000 mg | ORAL_TABLET | Freq: Two times a day (BID) | ORAL | 3 refills | Status: DC

## 2021-05-28 MED ORDER — FLUTICASONE PROPIONATE 50 MCG/ACT NA SUSP: NASAL | 2 refills | Status: DC
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
## 2021-05-28 NOTE — Patient Instructions (Addendum)
Topiramate Tablets What is this medication? TOPIRAMATE (toe PYRE a mate) prevents and controls seizures in people with epilepsy. It may also be used to prevent migraine headaches.   It works by calming overactive nerves in your body. This medicine may be used for other purposes; ask your health care provider or pharmacist if you have questions. COMMON BRAND NAME(S): Topamax, Topiragen What should I tell my care team before I take this medication? They need to know if you have any of these conditions: Bleeding disorder Kidney disease Lung disease Suicidal thoughts, plans or attempt An unusual or allergic reaction to topiramate, other medications, foods, dyes, or preservatives Pregnant or trying to get pregnant Breast-feeding How should I use this medication? Take this medication by mouth with water. Take it as directed on the prescription label at the same time every day. Do not cut, crush or chew this medicine. Swallow the tablets whole. You can take it with or without food. If it upsets your stomach, take it with food. Keep taking it unless your care team tells you to stop. A special MedGuide will be given to you by the pharmacist with each prescription and refill. Be sure to read this information carefully each time. Talk to your care team about the use of this medication in children. While it may be prescribed for children as young as 2 years for selected conditions, precautions do apply. Overdosage: If you think you have taken too much of this medicine contact a poison control center or emergency room at once. NOTE: This medicine is only for you. Do not share this medicine with others. What if I miss a dose? If you miss a dose, take it as soon as you can unless it is within 6 hours of the next dose. If it is within 6 hours of the next dose, skip the missed dose. Take the next dose at the normal time. Do not take double or extra doses. What may interact with this  medication? Acetazolamide Alcohol Antihistamines for allergy, cough, and cold Aspirin and aspirin-like medications Atropine Birth control pills Certain medications for anxiety or sleep Certain medications for bladder problems like oxybutynin, tolterodine Certain medications for depression like amitriptyline, fluoxetine, sertraline Certain medications for Parkinson's disease like benztropine, trihexyphenidyl Certain medications for seizures like carbamazepine, lamotrigine, phenobarbital, phenytoin, primidone, valproic acid, zonisamide Certain medications for stomach problems like dicyclomine, hyoscyamine Certain medications for travel sickness like scopolamine Certain medications that treat or prevent blood clots like warfarin, enoxaparin, dalteparin, apixaban, dabigatran, and rivaroxaban Digoxin Diltiazem General anesthetics like halothane, isoflurane, methoxyflurane, propofol Glyburide Hydrochlorothiazide Ipratropium Lithium Medications that relax muscles Metformin Narcotic medications for pain NSAIDs, medications for pain and inflammation, like ibuprofen or naproxen Phenothiazines like chlorpromazine, mesoridazine, prochlorperazine, thioridazine Pioglitazone This list may not describe all possible interactions. Give your health care provider a list of all the medicines, herbs, non-prescription drugs, or dietary supplements you use. Also tell them if you smoke, drink alcohol, or use illegal drugs. Some items may interact with your medicine. What should I watch for while using this medication? Visit your care team for regular checks on your progress. Tell your care team if your symptoms do not start to get better or if they get worse. Do not suddenly stop taking this medication. You may develop a severe reaction. Your care team will tell you how much medication to take. If your care team wants you to stop the medication, the dose may be slowly lowered over time to avoid any side  effects.  Wear a medical ID bracelet or chain. Carry a card that describes your condition. List the medications and doses you take on the card. You may get drowsy or dizzy. Do not drive, use machinery, or do anything that needs mental alertness until you know how this medication affects you. Do not stand up or sit up quickly, especially if you are an older patient. This reduces the risk of dizzy or fainting spells. Alcohol may interfere with the effects of this medication. Avoid alcoholic drinks. This medication may cause serious skin reactions. They can happen weeks to months after starting the medication. Contact your care team right away if you notice fevers or flu-like symptoms with a rash. The rash may be red or purple and then turn into blisters or peeling of the skin. Or, you might notice a red rash with swelling of the face, lips or lymph nodes in your neck or under your arms. Watch for new or worsening thoughts of suicide or depression. This includes sudden changes in mood, behaviors, or thoughts. These changes can happen at any time but are more common in the beginning of treatment or after a change in dose. Call your care team right away if you experience these thoughts or worsening depression. This medication may slow your child's growth if it is taken for a long time at high doses. Your care team will monitor your child's growth. Using this medication for a long time may weaken your bones. The risk of bone fractures may be increased. Talk to your care team about your bone health. Do not become pregnant while taking this medication. Hormone forms of birth control may not work as well with this medication. Talk to your care team about other forms of birth control. There is potential for serious harm to an unborn child. Tell your care team right away if you think you might be pregnant. What side effects may I notice from receiving this medication? Side effects that you should report to your care  team as soon as possible: Allergic reactions--skin rash, itching, hives, swelling of the face, lips, tongue, or throat High acid level--trouble breathing, unusual weakness or fatigue, confusion, headache, fast or irregular heartbeat, nausea, vomiting High ammonia level--unusual weakness or fatigue, confusion, loss of appetite, nausea, vomiting, seizures Fever that does not go away, decrease in sweat Kidney stones--blood in the urine, pain or trouble passing urine, pain in the lower back or sides Redness, blistering, peeling or loosening of the skin, including inside the mouth Sudden eye pain or change in vision such as blurry vision, seeing halos around lights, vision loss Thoughts of suicide or self-harm, worsening mood, feelings of depression Side effects that usually do not require medical attention (report to your care team if they continue or are bothersome): Burning or tingling sensation in hands or feet Difficulty with paying attention, memory, or speech Dizziness Drowsiness Fatigue Loss of appetite with weight loss Slow or sluggish movements of the body This list may not describe all possible side effects. Call your doctor for medical advice about side effects. You may report side effects to FDA at 1-800-FDA-1088. Where should I keep my medication? Keep out of the reach of children and pets. Store between 15 and 30 degrees C (59 and 86 degrees F). Protect from moisture. Keep the container tightly closed. Get rid of any unused medication after the expiration date. To get rid of medications that are no longer needed or have expired: Take the medication to a medication take-back program. Check  with your pharmacy or law enforcement to find a location. If you cannot return the medication, check the label or package insert to see if the medication should be thrown out in the garbage or flushed down the toilet. If you are not sure, ask your care team. If it is safe to put it in the trash,  empty the medication out of the container. Mix the medication with cat litter, dirt, coffee grounds, or other unwanted substance. Seal the mixture in a bag or container. Put it in the trash. NOTE: This sheet is a summary. It may not cover all possible information. If you have questions about this medicine, talk to your doctor, pharmacist, or health care provider.  2022 Elsevier/Gold Standard (2020-08-21 00:00:00) Dizziness Dizziness is a common problem. It is a feeling of unsteadiness or light-headedness. You may feel like you are about to faint. Dizziness can lead to injury if you stumble or fall. Anyone can become dizzy, but dizziness is more common in older adults. This condition can be caused by a number of things, including medicines, dehydration, or illness. Follow these instructions at home: Eating and drinking  Drink enough fluid to keep your urine pale yellow. This helps to keep you from becoming dehydrated. Try to drink more clear fluids, such as water. Do not drink alcohol. Limit your caffeine intake if told to do so by your health care provider. Check ingredients and nutrition facts to see if a food or beverage contains caffeine. Limit your salt (sodium) intake if told to do so by your health care provider. Check ingredients and nutrition facts to see if a food or beverage contains sodium. Activity  Avoid making quick movements. Rise slowly from chairs and steady yourself until you feel okay. In the morning, first sit up on the side of the bed. When you feel okay, stand slowly while you hold onto something until you know that your balance is good. If you need to stand in one place for a long time, move your legs often. Tighten and relax the muscles in your legs while you are standing. Do not drive or use machinery if you feel dizzy. Avoid bending down if you feel dizzy. Place items in your home so that they are easy for you to reach without leaning over. Lifestyle Do not use any  products that contain nicotine or tobacco. These products include cigarettes, chewing tobacco, and vaping devices, such as e-cigarettes. If you need help quitting, ask your health care provider. Try to reduce your stress level by using methods such as yoga or meditation. Talk with your health care provider if you need help to manage your stress. General instructions Watch your dizziness for any changes. Take over-the-counter and prescription medicines only as told by your health care provider. Talk with your health care provider if you think that your dizziness is caused by a medicine that you are taking. Tell a friend or a family member that you are feeling dizzy. If he or she notices any changes in your behavior, have this person call your health care provider. Keep all follow-up visits. This is important. Contact a health care provider if: Your dizziness does not go away or you have new symptoms. Your dizziness or light-headedness gets worse. You feel nauseous. You have reduced hearing. You have a fever. You have neck pain or a stiff neck. Your dizziness leads to an injury or a fall. Get help right away if: You vomit or have diarrhea and are unable to eat  or drink anything. You have problems talking, walking, swallowing, or using your arms, hands, or legs. You feel generally weak. You have any bleeding. You are not thinking clearly or you have trouble forming sentences. It may take a friend or family member to notice this. You have chest pain, abdominal pain, shortness of breath, or sweating. Your vision changes or you develop a severe headache. These symptoms may represent a serious problem that is an emergency. Do not wait to see if the symptoms will go away. Get medical help right away. Call your local emergency services (911 in the [place] Do not drive yourself to the hospital. Summary Dizziness is a feeling of unsteadiness or light-headedness. This condition can be caused by a number  of things, including medicines, dehydration, or illness. Anyone can become dizzy, but dizziness is more common in older adults. Drink enough fluid to keep your urine pale yellow. Do not drink alcohol. Avoid making quick movements if you feel dizzy. Monitor your dizziness for any changes. This information is not intended to replace advice given to you by your health care provider. Make sure you discuss any questions you have with your health care provider. Document Revised: 05/01/2020 Document Reviewed: 05/01/2020 Elsevier Patient Education  2022 [employer].

## 2021-05-29 DIAGNOSIS — F411 Generalized anxiety disorder: Secondary | ICD-10-CM | POA: Diagnosis not present

## 2021-06-28 DIAGNOSIS — F411 Generalized anxiety disorder: Secondary | ICD-10-CM | POA: Diagnosis not present

## 2021-07-02 ENCOUNTER — Other Ambulatory Visit: Payer: Self-pay

## 2021-07-02 ENCOUNTER — Encounter: Payer: BC Managed Care – PPO | Attending: Physician Assistant | Admitting: Dietician

## 2021-07-02 ENCOUNTER — Encounter: Payer: Self-pay | Admitting: Dietician

## 2021-07-02 VITALS — Ht 62.0 in | Wt 163.2 lb

## 2021-07-02 DIAGNOSIS — E119 Type 2 diabetes mellitus without complications: Secondary | ICD-10-CM | POA: Diagnosis not present

## 2021-07-02 NOTE — Patient Instructions (Addendum)
Continue to monitor the 14 day average value on your glucometer. It should continue to come down gradually.  Take the time to evaluate the effect of hummus and alcohol on your fasting blood sugar numbers.  Continue to be inquisitive into the things that affect your fasting glucose!  Try to build your own cauliflower pizza. Try Rao's pizza sauce or make your own with Pomi strained tomatoes. Add a reduced fat mozzarella and tons of veggies.  Contact your PCP for a 6 month A1c check-up!

## 2021-07-02 NOTE — Progress Notes (Signed)
Diabetes Self-Management Education  Visit Type: Follow-up  Appt. Start Time: 1645 Appt. End Time: 1610  07/02/2021  [name], identified by name and date of birth, is a 46 y.o. female with a diagnosis of Diabetes:  .   ASSESSMENT Pt reports checking FBG daily, usually ranges from 110-140, 14 day average is 135. Pt reports being curious as to the things that affect their blood sugar. Pt notices having alcohol in the evening will cause elevated FBG the next morning.  Pt reports being able to follow a more structured eating pattern while at work, pt will graze on hummus and fruit or crackers. Pt reports a bout of high stress over Christmas, states it has gotten better. Pt reports trying to exercise a little more, doing resistance bands for PT, vibration boards. Pt reports making time to connect with friends to de-stress as well.  Pt reports sleep disturbances that wake them up multiple times at night, this happens intermittently.  Height 5' 2"  (1.575 m), weight 163 lb 3.2 oz (74 kg). Body mass index is 29.85 kg/m.   Diabetes Self-Management Education - 07/02/21 1651       Visit Information   Visit Type Follow-up      Psychosocial Assessment   Patient Belief/Attitude about Diabetes Other (comment)   Slight frustration witth FBG   Self-management support --   Pt working with counselor     Complications   How often do you check your blood sugar? 1-2 times/day    Fasting Blood glucose range (mg/dL) 70-129;130-179      Dietary Intake   Breakfast gluten-free egg wrap with salt, pepper. Black coffee    Snack (morning) 2 [employer] spinach egg bites, coffee with coconut milk    Lunch Cabbage salad, pork loin, skinny girl salad dressing, water    Snack (afternoon) [employer]    Snack (evening) Apple with chocolate hummus    Beverage(s) coffee, water      Exercise   Exercise Type ADL's;Moderate (swimming / aerobic walking)    How many days per week to you exercise? 3    How  many minutes per day do you exercise? 45    Total minutes per week of exercise 135      Individualized Goals (developed by patient)   Nutrition Follow meal plan discussed;General guidelines for healthy choices and portions discussed    Physical Activity Exercise 3-5 times per week    Monitoring  test my blood glucose as discussed;send in my blood glucose log as discussed      Patient Self-Evaluation of Goals - Patient rates self as meeting previously set goals (% of time)   Nutrition 50 - 75 %    Physical Activity 25 - 50%    Medications >75%    Monitoring >75%    Problem Solving 25 - 50%    Reducing Risk 50 - 75 %    Health Coping < 25%      Post-Education Assessment   Patient understands the diabetes disease and treatment process. Needs Review    Patient understands incorporating nutritional management into lifestyle. Needs Review    Patient undertands incorporating physical activity into lifestyle. Needs Review    Patient understands using medications safely. Needs Review    Patient understands monitoring blood glucose, interpreting and using results Needs Review    Patient understands prevention, detection, and treatment of acute complications. Needs Review    Patient understands prevention, detection, and treatment of chronic complications. Needs Review  Patient understands how to develop strategies to address psychosocial issues. Needs Review    Patient understands how to develop strategies to promote health/change behavior. Needs Review      Outcomes   Expected Outcomes Demonstrated interest in learning. Expect positive outcomes    Future DMSE 3-4 months    Program Status Not Completed      Subsequent Visit   Since your last visit have you continued or begun to take your medications as prescribed? Yes    Since your last visit have you had your blood pressure checked? Yes    Is your most recent blood pressure lower, unchanged, or higher since your last visit? Unchanged     Since your last visit have you experienced any weight changes? Loss    Weight Loss (lbs) 1    Since your last visit, are you checking your blood glucose at least once a day? Yes             Individualized Plan for Diabetes Self-Management Training:   Learning Objective:  Patient will have a greater understanding of diabetes self-management. Patient education plan is to attend individual and/or group sessions per assessed needs and concerns.   Plan:   Patient Instructions  Continue to monitor the 14 day average value on your glucometer. It should continue to come down gradually.  Take the time to evaluate the effect of hummus and alcohol on your fasting blood sugar numbers.  Continue to be inquisitive into the things that affect your fasting glucose!  Try to build your own cauliflower pizza. Try Rao's pizza sauce or make your own with Pomi strained tomatoes. Add a reduced fat mozzarella and tons of veggies.  Contact your PCP for a 6 month A1c check-up!  Expected Outcomes:  Demonstrated interest in learning. Expect positive outcomes  If problems or questions, patient to contact team via:  Phone and Email  Future DSME appointment: 3-4 months

## 2021-07-16 ENCOUNTER — Other Ambulatory Visit: Payer: Self-pay | Admitting: Physician Assistant

## 2021-07-26 DIAGNOSIS — Z01419 Encounter for gynecological examination (general) (routine) without abnormal findings: Secondary | ICD-10-CM | POA: Diagnosis not present

## 2021-07-26 DIAGNOSIS — N939 Abnormal uterine and vaginal bleeding, unspecified: Secondary | ICD-10-CM | POA: Diagnosis not present

## 2021-07-26 DIAGNOSIS — Z1231 Encounter for screening mammogram for malignant neoplasm of breast: Secondary | ICD-10-CM | POA: Diagnosis not present

## 2021-07-26 DIAGNOSIS — Z6829 Body mass index (BMI) 29.0-29.9, adult: Secondary | ICD-10-CM | POA: Diagnosis not present

## 2021-07-31 DIAGNOSIS — F411 Generalized anxiety disorder: Secondary | ICD-10-CM | POA: Diagnosis not present

## 2021-08-03 ENCOUNTER — Ambulatory Visit: Payer: BC Managed Care – PPO | Admitting: Physician Assistant

## 2021-08-03 ENCOUNTER — Other Ambulatory Visit: Payer: Self-pay

## 2021-08-03 VITALS — BP 112/79 | HR 82 | Temp 97.8°F | Ht 62.0 in | Wt 161.2 lb

## 2021-08-03 DIAGNOSIS — E282 Polycystic ovarian syndrome: Secondary | ICD-10-CM | POA: Diagnosis not present

## 2021-08-03 DIAGNOSIS — N939 Abnormal uterine and vaginal bleeding, unspecified: Secondary | ICD-10-CM | POA: Diagnosis not present

## 2021-08-03 DIAGNOSIS — E039 Hypothyroidism, unspecified: Secondary | ICD-10-CM | POA: Diagnosis not present

## 2021-08-03 DIAGNOSIS — E1165 Type 2 diabetes mellitus with hyperglycemia: Secondary | ICD-10-CM | POA: Diagnosis not present

## 2021-08-03 LAB — POCT GLYCOSYLATED HEMOGLOBIN (HGB A1C): Hemoglobin A1C: 6.2 % — AB (ref 4.0–5.6)

## 2021-08-03 NOTE — Progress Notes (Signed)
Subjective:    Patient ID: Shelly Lewis, female    DOB: 03-Jan-1976, 46 y.o.   MRN: [id number]  Chief Complaint  Patient presents with   Diabetes    Diabetes  Patient is in today for follow-up on her diabetes.  This is a follow-up from virtual visit on 02/28/2021.  Patient had a hemoglobin A1c of 7.0. It was decided at that time to start on 1 tablet of metformin 500 mg at bedtime and to work on lifestyle changes prior to this appointment.  States that she has been feeling well.  She has been walking and also exercising with a vibration board.  She has been meeting with a nutritionist.  States that she still has cravings for pizza and donuts. Portion-control is still problematic for her. She is continuing to work with nutritionist on this.  She says it has been really helpful to think of food in different ways and to have ideas for substitutions with lower sugar content.  She has not had any problems with the metformin.  States that sometimes her fasting sugar in the mornings is still in the 130s to 140s and she is nervous that her number today is still not going to change much.  She is frustrated because she is still having trouble losing weight.  She has an eye doctor with annual eye exams.  Past Medical History:  Diagnosis Date   Allergy    Anxiety    Asthma    Blood transfusion without reported diagnosis    Dizziness    Family history of premature coronary artery disease 08/22/2015   GERD (gastroesophageal reflux disease)    Headache    Hyperlipidemia 08/22/2015   IBS (irritable bowel syndrome)    Thyroid disease    Hypothyroidism    Past Surgical History:  Procedure Laterality Date   ECTOPIC PREGNANCY SURGERY     ELBOW FRACTURE SURGERY Left 01/2021   MIDDLE EAR SURGERY     TONSILLECTOMY      Family History  Problem Relation Age of Onset   Hyperlipidemia Mother    Depression Mother    Diabetes Father    Hyperlipidemia Father    Cancer Maternal Grandmother     Heart disease Maternal Grandmother    Heart disease Paternal Grandmother    Hyperlipidemia Paternal Grandfather    Stroke Paternal Grandfather     Social History   Tobacco Use   Smoking status: Never   Smokeless tobacco: Never  Substance Use Topics   Alcohol use: Yes    Alcohol/week: 4.0 standard drinks    Types: 4 Glasses of wine per week    Comment: 2 glasses wine per week   Drug use: No     Allergies  Allergen Reactions   Mold Extract [Trichophyton] Other (See Comments)    congestion   Gluten Meal Diarrhea    Migraines, Reflux   Lac Bovis Diarrhea    Review of Systems NEGATIVE UNLESS OTHERWISE INDICATED IN HPI      Objective:     BP 112/79    Pulse 82    Temp 97.8 F (36.6 C)    Ht 5' 2"  (1.575 m)    Wt 161 lb 4 oz (73.1 kg)    SpO2 96%    BMI 29.49 kg/m   Wt Readings from Last 3 Encounters:  08/03/21 161 lb 4 oz (73.1 kg)  07/02/21 163 lb 3.2 oz (74 kg)  05/28/21 161 lb (73 kg)    BP Readings  from Last 3 Encounters:  08/03/21 112/79  02/23/21 113/82  01/08/21 106/76     Physical Exam Constitutional:      Appearance: Normal appearance.  Cardiovascular:     Rate and Rhythm: Normal rate and regular rhythm.  Pulmonary:     Effort: Pulmonary effort is normal. No respiratory distress.     Breath sounds: Normal breath sounds.  Neurological:     General: No focal deficit present.     Mental Status: She is alert.  Psychiatric:        Mood and Affect: Mood normal.        Behavior: Behavior normal.       Assessment & Plan:   Problem List Items Addressed This Visit   None Visit Diagnoses     Type 2 diabetes mellitus with hyperglycemia, without long-term current use of insulin ([place])    -  Primary   Relevant Orders   POCT HgB A1C (Completed)       1. Type 2 diabetes mellitus with hyperglycemia, without long-term current use of insulin ([place]) Congratulated patient on her new A1c level of 6.2 in office today.  This is down from 7.0 -6 months ago.   She will continue to take metformin 500 mg 1 tablet in the evenings.  She will continue to work on lifestyle changes that she has been doing and she will continue to work with nutritionist.  She will stay up-to-date with eye exams.  She is very encouraged by this and plans to keep working hard.  Recheck fasting labs with a physical in 4 to 6 months.  She knows to call sooner if any problems.   Sharlie Shreffler M Sanjana Folz, PA-C

## 2021-08-06 DIAGNOSIS — N939 Abnormal uterine and vaginal bleeding, unspecified: Secondary | ICD-10-CM | POA: Insufficient documentation

## 2021-08-08 DIAGNOSIS — N939 Abnormal uterine and vaginal bleeding, unspecified: Secondary | ICD-10-CM | POA: Diagnosis not present

## 2021-08-28 DIAGNOSIS — F411 Generalized anxiety disorder: Secondary | ICD-10-CM | POA: Diagnosis not present

## 2021-09-10 ENCOUNTER — Other Ambulatory Visit: Payer: Self-pay

## 2021-09-10 ENCOUNTER — Encounter (HOSPITAL_BASED_OUTPATIENT_CLINIC_OR_DEPARTMENT_OTHER): Payer: Self-pay | Admitting: Obstetrics and Gynecology

## 2021-09-10 NOTE — Progress Notes (Signed)
Spoke w/ via phone for pre-op interview: patient ?Lab needs dos: UPT and I-Stat ?Lab results: EKG 01/08/21 ?COVID test: patient states asymptomatic no test needed. ?Arrive at [facility] 09/17/21 ?NPO after MN except clear liquids.Clear liquids from MN until 0730 ?Med rec completed. ?Medications to take morning of surgery: Topamax and Lexapro; Albuterol inhaler PRN- please bring DOS ?Diabetic medication: NA ?Patient instructed no nail polish to be worn day of surgery. ?Patient instructed to bring photo id and insurance card day of surgery. ?Patient aware to have Driver (ride ) / caregiver for 24 hours after surgery. (Husband to drive) ?Patient Special Instructions: NA ?Pre-Op special Istructions:NA ?Patient verbalized understanding of instructions that were given at this phone interview. ?Patient denies shortness of breath, chest pain, fever, cough at this phone interview.  ?

## 2021-09-11 ENCOUNTER — Encounter: Payer: Self-pay | Admitting: Dietician

## 2021-09-11 ENCOUNTER — Encounter: Payer: BC Managed Care – PPO | Attending: Physician Assistant | Admitting: Dietician

## 2021-09-11 ENCOUNTER — Ambulatory Visit: Payer: BC Managed Care – PPO | Admitting: Dietician

## 2021-09-11 DIAGNOSIS — E1165 Type 2 diabetes mellitus with hyperglycemia: Secondary | ICD-10-CM | POA: Insufficient documentation

## 2021-09-11 DIAGNOSIS — E785 Hyperlipidemia, unspecified: Secondary | ICD-10-CM | POA: Diagnosis not present

## 2021-09-11 DIAGNOSIS — Z713 Dietary counseling and surveillance: Secondary | ICD-10-CM | POA: Insufficient documentation

## 2021-09-11 DIAGNOSIS — E119 Type 2 diabetes mellitus without complications: Secondary | ICD-10-CM

## 2021-09-11 NOTE — Progress Notes (Signed)
Diabetes Self-Management Education ? ?Visit Type: Follow-up ? ?Appt. Start Time: 1635 Appt. End Time: 0865 ? ?09/11/2021 ? ?[name], identified by name and date of birth, is a 46 y.o. female with a diagnosis of Diabetes:  .  ? ?ASSESSMENT ?Visit was conducted on the [employer] ?Pt A1c is down to 6.2 from 7.0! Pt is very excited about their progress. ?Pt is still checking FBG daily, numbers range from 113 - 130. ?Pt reports a FBG value of 160 one morning and found out that rice causes high FBGs. ?Pt continues to take metformin QD, no side effects. ?Pt reports that their personal stress level is much lower now, still has occasional high stress days at work. ?Pt is walking with their friend on Sundays, pt states these walks can be exhausting. Pt is still using their vibration board for activity as well. ?Pt reports losing their taste for berries and chocolate hummus, also cutting back on wine due to reflux. ?Pt has been making their own pizzas with fresh ingredients. ?Pt states they are being more mindful when they eat as well. ? ?There is no height or weight on file to calculate BMI. ? ? Diabetes Self-Management Education - 09/11/21 1600   ? ?  ? Visit Information  ? Visit Type Follow-up   ?  ? Psychosocial Assessment  ? Patient Belief/Attitude about Diabetes Motivated to manage diabetes   Pt is very encouraged by their progress  ?  ? Complications  ? Last HgB A1C per patient/outside source 6.2 %   08/03/2021  ? How often do you check your blood sugar? 1-2 times/day   ? Fasting Blood glucose range (mg/dL) 70-129   ?  ? Dietary Intake  ? Breakfast Starbucks latte with coconut milk, kale egg bites   ? Snack (morning) [employer] One [employer]   ? Lunch Can't recall   ? Dinner Pork loin, mashed potatoes   ? Beverage(s) Latte, water, bubbly   ?  ? Exercise  ? Exercise Type ADL's;Moderate (swimming / aerobic walking)   ? How many days per week to you exercise? 4   ? How many minutes per day do you exercise? 30   ?  Total minutes per week of exercise 120   ?  ? Individualized Goals (developed by patient)  ? Physical Activity Exercise 3-5 times per week   ? Monitoring  test my blood glucose as discussed;send in my blood glucose log as discussed   ?  ? Patient Self-Evaluation of Goals - Patient rates self as meeting previously set goals (% of time)  ? Nutrition 50 - 75 %   ? Physical Activity 50 - 75 %   ? Medications >75%   ? Monitoring >75%   ? Problem Solving 50 - 75 %   Pt has been able to isolate foods responsible for elevated FBG  ? Reducing Risk 50 - 75 %   ? Health Coping 25 - 50%   Stress level has improved  ?  ? Post-Education Assessment  ? Patient understands the diabetes disease and treatment process. Needs Review   ? Patient understands incorporating nutritional management into lifestyle. Needs Review   ? Patient undertands incorporating physical activity into lifestyle. Demonstrates understanding / competency   ? Patient understands using medications safely. Demonstrates understanding / competency   ? Patient understands monitoring blood glucose, interpreting and using results Demonstrates understanding / competency   ? Patient understands prevention, detection, and treatment of acute complications. Needs Review   ?  Patient understands prevention, detection, and treatment of chronic complications. Needs Review   ? Patient understands how to develop strategies to address psychosocial issues. Demonstrates understanding / competency   ? Patient understands how to develop strategies to promote health/change behavior. Demonstrates understanding / competency   ?  ? Outcomes  ? Expected Outcomes Demonstrated interest in learning. Expect positive outcomes   ? Future DMSE 3-4 months   ? Program Status Not Completed   ?  ? Subsequent Visit  ? Since your last visit have you continued or begun to take your medications as prescribed? Yes   ? Since your last visit have you had your blood pressure checked? Yes   ? Is your most  recent blood pressure lower, unchanged, or higher since your last visit? Lower   ? Since your last visit have you experienced any weight changes? Loss   ? Weight Loss (lbs) 3   ? Since your last visit, are you checking your blood glucose at least once a day? Yes   ? ?  ?  ? ?  ? ? ?Individualized Plan for Diabetes Self-Management Training:  ? ?Learning Objective:  Patient will have a greater understanding of diabetes self-management. ?Patient education plan is to attend individual and/or group sessions per assessed needs and concerns. ?  ?Plan:  ? ?Patient Instructions  ?Try having rice for your midday meal to see how much it raises your blood sugar. Have 1/3 cup of cooked rice and check your blood sugar 2 hours after you begin eating. It should raise your blood sugar no more than 40-60 points from your fasting value. ? ?Congratulations on your success! Keep up the hard work and consistency!!! ? ?Expected Outcomes:  Demonstrated interest in learning. Expect positive outcomes ? ?If problems or questions, patient to contact team via:  Phone and Email ? ?Future DSME appointment: 3-4 months ?

## 2021-09-11 NOTE — Patient Instructions (Addendum)
Try having rice for your midday meal to see how much it raises your blood sugar. Have 1/3 cup of cooked rice and check your blood sugar 2 hours after you begin eating. It should raise your blood sugar no more than 40-60 points from your fasting value. ? ?Congratulations on your success! Keep up the hard work and consistency!!! ?

## 2021-09-13 ENCOUNTER — Other Ambulatory Visit: Payer: Self-pay | Admitting: Obstetrics and Gynecology

## 2021-09-13 DIAGNOSIS — F411 Generalized anxiety disorder: Secondary | ICD-10-CM | POA: Diagnosis not present

## 2021-09-17 ENCOUNTER — Encounter (HOSPITAL_BASED_OUTPATIENT_CLINIC_OR_DEPARTMENT_OTHER)
Admission: RE | Disposition: A | Discharge status: Home / Self Care | Payer: Self-pay | Source: Home / Self Care | Attending: Obstetrics and Gynecology

## 2021-09-17 ENCOUNTER — Ambulatory Visit (HOSPITAL_BASED_OUTPATIENT_CLINIC_OR_DEPARTMENT_OTHER): Payer: BC Managed Care – PPO | Admitting: Anesthesiology

## 2021-09-17 ENCOUNTER — Ambulatory Visit (HOSPITAL_BASED_OUTPATIENT_CLINIC_OR_DEPARTMENT_OTHER)
Admission: RE | Admit: 2021-09-17 | Discharge: 2021-09-17 | Disposition: A | Discharge status: Home / Self Care | Payer: BC Managed Care – PPO | Attending: Obstetrics and Gynecology | Admitting: Obstetrics and Gynecology

## 2021-09-17 ENCOUNTER — Encounter (HOSPITAL_BASED_OUTPATIENT_CLINIC_OR_DEPARTMENT_OTHER): Payer: Self-pay | Admitting: Obstetrics and Gynecology

## 2021-09-17 ENCOUNTER — Other Ambulatory Visit: Payer: Self-pay

## 2021-09-17 DIAGNOSIS — N921 Excessive and frequent menstruation with irregular cycle: Secondary | ICD-10-CM | POA: Diagnosis not present

## 2021-09-17 DIAGNOSIS — Z7984 Long term (current) use of oral hypoglycemic drugs: Secondary | ICD-10-CM | POA: Diagnosis not present

## 2021-09-17 DIAGNOSIS — E119 Type 2 diabetes mellitus without complications: Secondary | ICD-10-CM | POA: Insufficient documentation

## 2021-09-17 DIAGNOSIS — N939 Abnormal uterine and vaginal bleeding, unspecified: Secondary | ICD-10-CM | POA: Diagnosis not present

## 2021-09-17 DIAGNOSIS — N8003 Adenomyosis of the uterus: Secondary | ICD-10-CM | POA: Insufficient documentation

## 2021-09-17 DIAGNOSIS — N840 Polyp of corpus uteri: Secondary | ICD-10-CM | POA: Diagnosis not present

## 2021-09-17 HISTORY — PX: DILATATION & CURETTAGE/HYSTEROSCOPY WITH MYOSURE: SHX6511

## 2021-09-17 HISTORY — DX: Type 2 diabetes mellitus without complications: E11.9

## 2021-09-17 HISTORY — DX: Nausea with vomiting, unspecified: R11.2

## 2021-09-17 HISTORY — DX: Other specified postprocedural states: Z98.890

## 2021-09-17 LAB — POCT I-STAT, CHEM 8
BUN: 17 mg/dL (ref 6–20)
Calcium, Ion: 1.07 mmol/L — ABNORMAL LOW (ref 1.15–1.40)
Chloride: 108 mmol/L (ref 98–111)
Creatinine, Ser: 0.7 mg/dL (ref 0.44–1.00)
Glucose, Bld: 140 mg/dL — ABNORMAL HIGH (ref 70–99)
HCT: 42 % (ref 36.0–46.0)
Hemoglobin: 14.3 g/dL (ref 12.0–15.0)
Potassium: 4.3 mmol/L (ref 3.5–5.1)
Sodium: 138 mmol/L (ref 135–145)
TCO2: 20 mmol/L — ABNORMAL LOW (ref 22–32)

## 2021-09-17 LAB — CBC
HCT: 41.7 % (ref 36.0–46.0)
Hemoglobin: 13.5 g/dL (ref 12.0–15.0)
MCH: 28.8 pg (ref 26.0–34.0)
MCHC: 32.4 g/dL (ref 30.0–36.0)
MCV: 89.1 fL (ref 80.0–100.0)
Platelets: 402 10*3/uL — ABNORMAL HIGH (ref 150–400)
RBC: 4.68 MIL/uL (ref 3.87–5.11)
RDW: 12.6 % (ref 11.5–15.5)
WBC: 5.7 10*3/uL (ref 4.0–10.5)
nRBC: 0 % (ref 0.0–0.2)

## 2021-09-17 LAB — TYPE AND SCREEN
ABO/RH(D): A POS
Antibody Screen: NEGATIVE

## 2021-09-17 LAB — ABO/RH: ABO/RH(D): A POS

## 2021-09-17 LAB — POCT PREGNANCY, URINE: Preg Test, Ur: NEGATIVE

## 2021-09-17 LAB — GLUCOSE, CAPILLARY: Glucose-Capillary: 140 mg/dL — ABNORMAL HIGH (ref 70–99)

## 2021-09-17 SURGERY — DILATATION & CURETTAGE/HYSTEROSCOPY WITH MYOSURE
Anesthesia: General

## 2021-09-17 MED ORDER — SCOPOLAMINE 1 MG/3DAYS TD PT72
MEDICATED_PATCH | TRANSDERMAL | Status: AC
  Filled 2021-09-17: qty 1

## 2021-09-17 MED ORDER — MIDAZOLAM HCL 2 MG/2ML IJ SOLN
INTRAMUSCULAR | Status: DC | PRN
Start: 2021-09-17 — End: 2021-09-17
  Administered 2021-09-17: 2 mg via INTRAVENOUS

## 2021-09-17 MED ORDER — DEXAMETHASONE SODIUM PHOSPHATE 10 MG/ML IJ SOLN
INTRAMUSCULAR | Status: AC
  Filled 2021-09-17: qty 1

## 2021-09-17 MED ORDER — KETOROLAC TROMETHAMINE 30 MG/ML IJ SOLN
INTRAMUSCULAR | Status: DC | PRN
  Administered 2021-09-17: 30 mg via INTRAVENOUS

## 2021-09-17 MED ORDER — POVIDONE-IODINE 10 % EX SWAB
2.0000 | Freq: Once | CUTANEOUS | Status: DC
Start: 2021-09-17 — End: 2021-09-17

## 2021-09-17 MED ORDER — SCOPOLAMINE 1 MG/3DAYS TD PT72
1.0000 | MEDICATED_PATCH | TRANSDERMAL | Status: DC
  Administered 2021-09-17: 1.5 mg via TRANSDERMAL

## 2021-09-17 MED ORDER — ONDANSETRON HCL 4 MG/2ML IJ SOLN
INTRAMUSCULAR | Status: AC
  Filled 2021-09-17: qty 2

## 2021-09-17 MED ORDER — LACTATED RINGERS IV SOLN: INTRAVENOUS | Status: DC

## 2021-09-17 MED ORDER — LIDOCAINE HCL (PF) 2 % IJ SOLN
INTRAMUSCULAR | Status: AC
  Filled 2021-09-17: qty 5

## 2021-09-17 MED ORDER — FENTANYL CITRATE (PF) 100 MCG/2ML IJ SOLN
INTRAMUSCULAR | Status: DC | PRN
  Administered 2021-09-17 (×2): 50 ug via INTRAVENOUS

## 2021-09-17 MED ORDER — BUPIVACAINE HCL (PF) 0.25 % IJ SOLN
INTRAMUSCULAR | Status: DC | PRN
  Administered 2021-09-17: 20 mL

## 2021-09-17 MED ORDER — LIDOCAINE 2% (20 MG/ML) 5 ML SYRINGE
INTRAMUSCULAR | Status: DC | PRN
Start: 2021-09-17 — End: 2021-09-17
  Administered 2021-09-17: 60 mg via INTRAVENOUS

## 2021-09-17 MED ORDER — ACETAMINOPHEN 500 MG PO TABS
1000.0000 mg | ORAL_TABLET | Freq: Once | ORAL | Status: AC
  Administered 2021-09-17: 1000 mg via ORAL

## 2021-09-17 MED ORDER — TRAMADOL HCL 50 MG PO TABS: 50.0000 mg | ORAL_TABLET | Freq: Four times a day (QID) | ORAL | 0 refills | Status: DC | PRN

## 2021-09-17 MED ORDER — ONDANSETRON HCL 4 MG/2ML IJ SOLN
INTRAMUSCULAR | Status: DC | PRN
  Administered 2021-09-17: 4 mg via INTRAVENOUS

## 2021-09-17 MED ORDER — CEFAZOLIN SODIUM-DEXTROSE 2-4 GM/100ML-% IV SOLN
2.0000 g | INTRAVENOUS | Status: AC
  Administered 2021-09-17: 2 g via INTRAVENOUS

## 2021-09-17 MED ORDER — FENTANYL CITRATE (PF) 100 MCG/2ML IJ SOLN: 25.0000 ug | INTRAMUSCULAR | Status: DC | PRN

## 2021-09-17 MED ORDER — ACETAMINOPHEN 500 MG PO TABS
ORAL_TABLET | ORAL | Status: AC
  Filled 2021-09-17: qty 2

## 2021-09-17 MED ORDER — SODIUM CHLORIDE 0.9 % IR SOLN
Status: DC | PRN
Start: 2021-09-17 — End: 2021-09-17
  Administered 2021-09-17: 3000 mL

## 2021-09-17 MED ORDER — PROPOFOL 10 MG/ML IV BOLUS
INTRAVENOUS | Status: AC
Start: 2021-09-17 — End: ?
  Filled 2021-09-17: qty 20

## 2021-09-17 MED ORDER — PROPOFOL 10 MG/ML IV BOLUS
INTRAVENOUS | Status: DC | PRN
  Administered 2021-09-17: 200 mg via INTRAVENOUS

## 2021-09-17 MED ORDER — DEXAMETHASONE SODIUM PHOSPHATE 10 MG/ML IJ SOLN
INTRAMUSCULAR | Status: DC | PRN
  Administered 2021-09-17: 4 mg via INTRAVENOUS

## 2021-09-17 MED ORDER — MIDAZOLAM HCL 2 MG/2ML IJ SOLN
INTRAMUSCULAR | Status: AC
  Filled 2021-09-17: qty 2

## 2021-09-17 MED ORDER — CEFAZOLIN SODIUM-DEXTROSE 2-4 GM/100ML-% IV SOLN
INTRAVENOUS | Status: AC
  Filled 2021-09-17: qty 100

## 2021-09-17 MED ORDER — FENTANYL CITRATE (PF) 100 MCG/2ML IJ SOLN
INTRAMUSCULAR | Status: AC
  Filled 2021-09-17: qty 2

## 2021-09-17 MED ORDER — EPHEDRINE SULFATE-NACL 50-0.9 MG/10ML-% IV SOSY
PREFILLED_SYRINGE | INTRAVENOUS | Status: DC | PRN
  Administered 2021-09-17: 10 mg via INTRAVENOUS

## 2021-09-17 MED ORDER — VASOPRESSIN 20 UNIT/ML IV SOLN
INTRAVENOUS | Status: DC | PRN
  Administered 2021-09-17: 20 mL via INTRAMUSCULAR

## 2021-09-17 MED ORDER — KETOROLAC TROMETHAMINE 30 MG/ML IJ SOLN
INTRAMUSCULAR | Status: AC
  Filled 2021-09-17: qty 1

## 2021-09-17 SURGICAL SUPPLY — 20 items
CATH ROBINSON RED A/P 16FR (CATHETERS) ×2 IMPLANT
DECANTER SPIKE VIAL GLASS SM (MISCELLANEOUS) ×4 IMPLANT
DEVICE MYOSURE LITE (MISCELLANEOUS) ×1 IMPLANT
DEVICE MYOSURE REACH (MISCELLANEOUS) IMPLANT
DRSG TELFA 3X8 NADH (GAUZE/BANDAGES/DRESSINGS) ×2 IMPLANT
GAUZE 4X4 16PLY ~~LOC~~+RFID DBL (SPONGE) ×2 IMPLANT
GLOVE BIO SURGEON STRL SZ7.5 (GLOVE) ×2 IMPLANT
GLOVE BIOGEL PI IND STRL 7.0 (GLOVE) ×1 IMPLANT
GLOVE BIOGEL PI INDICATOR 7.0 (GLOVE) ×1
GOWN STRL REUS W/TWL LRG LVL3 (GOWN DISPOSABLE) ×4 IMPLANT
IV NS IRRIG 3000ML ARTHROMATIC (IV SOLUTION) ×2 IMPLANT
KIT PROCEDURE FLUENT (KITS) ×2 IMPLANT
KIT TURNOVER CYSTO (KITS) ×2 IMPLANT
NDL SPNL 22GX3.5 QUINCKE BK (NEEDLE) ×1 IMPLANT
NEEDLE SPNL 22GX3.5 QUINCKE BK (NEEDLE) ×2 IMPLANT
PACK VAGINAL MINOR WOMEN LF (CUSTOM PROCEDURE TRAY) ×2 IMPLANT
PAD DRESSING TELFA 3X8 NADH (GAUZE/BANDAGES/DRESSINGS) ×1 IMPLANT
PAD OB MATERNITY 4.3X12.25 (PERSONAL CARE ITEMS) ×2 IMPLANT
SEAL CERVICAL OMNI LOK (ABLATOR) IMPLANT
SEAL ROD LENS SCOPE MYOSURE (ABLATOR) ×2 IMPLANT

## 2021-09-17 NOTE — H&P (Signed)
Shelly Lewis is an 46 y.o. female. AUB with structural lesion ? ?Pertinent Gynecological History: ?Menses: flow is moderate ?Bleeding: intermenstrual bleeding ?Contraception: vasectomy ?DES exposure: denies ?Blood transfusions: none ?Sexually transmitted diseases: no past history ?Previous GYN Procedures: DNC  ?Last mammogram: normal Date: 2023 ?Last pap: normal Date: 2023 ?OB History: G5, P2  ? ?Menstrual History: ?Menarche age: 44 ?Patient's last menstrual period was 08/12/2021 (approximate). ?  ? ?Past Medical History:  ?Diagnosis Date  ? Allergy   ? Anxiety   ? Asthma   ? Blood transfusion without reported diagnosis   ? Diabetes mellitus without complication ([place])   ? Dizziness   ? Family history of premature coronary artery disease 08/22/2015  ? GERD (gastroesophageal reflux disease)   ? Headache   ? Hyperlipidemia 08/22/2015  ? IBS (irritable bowel syndrome)   ? PONV (postoperative nausea and vomiting)   ? Thyroid disease   ? Hypothyroidism  ? ? ?Past Surgical History:  ?Procedure Laterality Date  ? ECTOPIC PREGNANCY SURGERY    ? ELBOW FRACTURE SURGERY Left 01/2021  ? MIDDLE EAR SURGERY    ? TONSILLECTOMY    ? ? ?Family History  ?Problem Relation Age of Onset  ? Hyperlipidemia Mother   ? Depression Mother   ? Diabetes Father   ? Hyperlipidemia Father   ? Cancer Maternal Grandmother   ? Heart disease Maternal Grandmother   ? Heart disease Paternal Grandmother   ? Hyperlipidemia Paternal Grandfather   ? Stroke Paternal Grandfather   ? ? ?Social History:  reports that she has never smoked. She has never used smokeless tobacco. She reports current alcohol use of about 4.0 standard drinks per week. She reports that she does not use drugs. ? ?Allergies:  ?Allergies  ?Allergen Reactions  ? Mold Extract [Trichophyton] Other (See Comments)  ?  congestion  ? Gluten Meal Diarrhea  ?  Migraines, Reflux  ? Lac [place] Diarrhea  ? ? ?No medications prior to admission.  ? ? ?Review of Systems  ?Constitutional: Negative.    ?All other systems reviewed and are negative. ? ?Height 5' 2"  (1.575 m), weight 72.6 kg, last menstrual period 08/12/2021. ?Physical Exam ?Constitutional:   ?   Appearance: Normal appearance. She is normal weight.  ?HENT:  ?   Head: Normocephalic and atraumatic.  ?Cardiovascular:  ?   Rate and Rhythm: Normal rate and regular rhythm.  ?Pulmonary:  ?   Effort: Pulmonary effort is normal.  ?   Breath sounds: Normal breath sounds.  ?Abdominal:  ?   General: Bowel sounds are normal.  ?   Palpations: Abdomen is soft.  ?Genitourinary: ?   General: Normal vulva.  ?Musculoskeletal:     ?   General: Normal range of motion.  ?   Cervical back: Normal range of motion and neck supple.  ?Skin: ?   General: Skin is warm and dry.  ?Neurological:  ?   General: No focal deficit present.  ?   Mental Status: She is alert and oriented to person, place, and time.  ?Psychiatric:     ?   Mood and Affect: Mood normal.     ?   Behavior: Behavior normal.  ? ? ?No results found for this or any previous visit (from the past 24 hour(s)). ? ?No results found. ? ?Assessment/Plan: ?AUB with structural lesion. ?Diag HS, D&C Myosure,  ?Surgical risks discussed. Consent done. ? ?Izael Bessinger J ?09/17/2021, 6:39 AM ? ?

## 2021-09-17 NOTE — Discharge Instructions (Addendum)
? ?  D & C Home care Instructions: ? ? ?Personal hygiene:  Used sanitary napkins for vaginal drainage not tampons. Shower or tub bathe the day after your procedure. No douching until bleeding stops. Always wipe from front to back after  Elimination. ? ?Activity: Do not drive or operate any equipment today. The effects of the anesthesia are still present and drowsiness may result. Rest today, not necessarily flat bed rest, just take it easy. You may resume your normal activity in one to 2 days. ? ?Sexual activity: No intercourse for one week or as indicated by your physician ? ?Diet: Eat a light diet as desired this evening. You may resume a regular diet tomorrow. ? ?Return to work: One to 2 days. ? ?General Expectations of your surgery: Vaginal bleeding should be no heavier than a normal period. Spotting may continue up to 10 days. Mild cramps may continue for a couple of days. You may have a regular period in 2-6 weeks. ? ?Unexpected observations call your doctor if these occur: persistent or heavy bleeding. Severe abdominal cramping or pain. Elevation of temperature greater than 100?F. ? ?Call for an appointment in one week. ? ? ?Post Anesthesia Home Care Instructions ? ?Activity: ?Get plenty of rest for the remainder of the day. A responsible individual must stay with you for 24 hours following the procedure.  ?For the next 24 hours, DO NOT: ?-Drive a car ?-Paediatric nurse ?-Drink alcoholic beverages ?-Take any medication unless instructed by your physician ?-Make any legal decisions or sign important papers. ? ?Meals: ?Start with liquid foods such as gelatin or soup. Progress to regular foods as tolerated. Avoid greasy, spicy, heavy foods. If nausea and/or vomiting occur, drink only clear liquids until the nausea and/or vomiting subsides. Call your physician if vomiting continues. ? ?Special Instructions/Symptoms: ?Your throat may feel dry or sore from the anesthesia or the breathing tube placed in your throat  during surgery. If this causes discomfort, gargle with warm salt water. The discomfort should disappear within 24 hours. ? ?If you had a scopolamine patch placed behind your ear for the management of post- operative nausea and/or vomiting: ? ?1. The medication in the patch is effective for 72 hours, after which it should be removed.  Wrap patch in a tissue and discard in the trash. Wash hands thoroughly with soap and water. ?2. You may remove the patch earlier than 72 hours if you experience unpleasant side effects which may include dry mouth, dizziness or visual disturbances. ?3. Avoid touching the patch. Wash your hands with soap and water after contact with the patch. ?   ?No acetaminophen/Tylenol until after 3:00 pm today if needed. ?No ibuprofen, Advil, Aleve, Motrin, ketorolac, meloxicam, naproxen, or other NSAIDS until after 5:30 pm today if needed. ? ? ?

## 2021-09-17 NOTE — Progress Notes (Signed)
Patient seen and examined. ?Consent witnessed and signed. ?No changes noted. ?Update completed. ?Declines EAB ?BP 117/85   Pulse 77   Temp (!) 97.5 ?F (36.4 ?C) (Oral)   Resp 20   Ht 5' 2"  (1.575 m)   Wt 74.2 kg   LMP 09/11/2021 (Approximate)   SpO2 99%   BMI 29.92 kg/m?  ?CBC ?   ?Component Value Date/Time  ? WBC 5.7 09/17/2021 0919  ? RBC 4.68 09/17/2021 0919  ? HGB 14.3 09/17/2021 0937  ? HCT 42.0 09/17/2021 0937  ? PLT 402 (H) 09/17/2021 0919  ? MCV 89.1 09/17/2021 0919  ? MCH 28.8 09/17/2021 0919  ? MCHC 32.4 09/17/2021 0919  ? RDW 12.6 09/17/2021 0919  ? LYMPHSABS 1.7 02/23/2021 0848  ? MONOABS 0.4 02/23/2021 0848  ? EOSABS 0.2 02/23/2021 0848  ? BASOSABS 0.0 02/23/2021 0848  ? ? ?

## 2021-09-17 NOTE — Anesthesia Preprocedure Evaluation (Addendum)
Anesthesia Evaluation  ?Patient identified by MRN, date of birth, ID band ?Patient awake ? ? ? ?Reviewed: ?Allergy & Precautions, NPO status , Patient's Chart, lab work & pertinent test results ? ?History of Anesthesia Complications ?(+) PONV and history of anesthetic complications ? ?Airway ?Mallampati: II ? ?TM Distance: >3 FB ?Neck ROM: Full ? ? ? Dental ?no notable dental hx. ?(+) Teeth Intact, Dental Advisory Given ?  ?Pulmonary ?asthma ,  ?  ?Pulmonary exam normal ?breath sounds clear to auscultation ? ? ? ? ? ? Cardiovascular ?negative cardio ROS ?Normal cardiovascular exam ?Rhythm:Regular Rate:Normal ? ? ?  ?Neuro/Psych ? Headaches, PSYCHIATRIC DISORDERS Anxiety   ? GI/Hepatic ?Neg liver ROS, GERD  Controlled,  ?Endo/Other  ?diabetes, Type 2, Oral Hypoglycemic Agents ? Renal/GU ?negative Renal ROS  ?negative genitourinary ?  ?Musculoskeletal ?negative musculoskeletal ROS ?(+)  ? Abdominal ?  ?Peds ? Hematology ?negative hematology ROS ?(+)   ?Anesthesia Other Findings ? ? Reproductive/Obstetrics ? ?  ? ? ? ? ? ? ? ? ? ? ? ? ? ?  ?  ? ? ? ? ? ? ? ?Anesthesia Physical ?Anesthesia Plan ? ?ASA: 2 ? ?Anesthesia Plan: General  ? ?Post-op Pain Management:   ? ?Induction: Intravenous ? ?PONV Risk Score and Plan: 4 or greater and Ondansetron, Dexamethasone and Midazolam ? ?Airway Management Planned: LMA ? ?Additional Equipment:  ? ?Intra-op Plan:  ? ?Post-operative Plan: Extubation in OR ? ?Informed Consent: I have reviewed the patients History and Physical, chart, labs and discussed the procedure including the risks, benefits and alternatives for the proposed anesthesia with the patient or authorized representative who has indicated his/her understanding and acceptance.  ? ? ? ?Dental advisory given ? ?Plan Discussed with: CRNA ? ?Anesthesia Plan Comments:   ? ? ? ? ? ? ?Anesthesia Quick Evaluation ? ?

## 2021-09-17 NOTE — Anesthesia Procedure Notes (Signed)
Procedure Name: LMA Insertion ?Date/Time: 09/17/2021 11:04 AM ?Performed by: Mechele Claude, CRNA ?Pre-anesthesia Checklist: Patient identified, Emergency Drugs available, Suction available and Patient being monitored ?Patient Re-evaluated:Patient Re-evaluated prior to induction ?Oxygen Delivery Method: Circle system utilized ?Preoxygenation: Pre-oxygenation with 100% oxygen ?Induction Type: IV induction ?Ventilation: Mask ventilation without difficulty ?LMA: LMA inserted ?LMA Size: 4.0 ?Number of attempts: 1 ?Airway Equipment and Method: Bite block ?Placement Confirmation: positive ETCO2 ?Tube secured with: Tape ?Dental Injury: Teeth and Oropharynx as per pre-operative assessment  ? ? ? ? ?

## 2021-09-17 NOTE — Op Note (Signed)
09/17/2021 ? ?11:29 AM ? ?PATIENT:  Shelly Lewis  46 y.o. female ? ?PRE-OPERATIVE DIAGNOSIS:  Abnormal Uterine Bleeding ? ?POST-OPERATIVE DIAGNOSIS:  Abnormal Uterine Bleeding ? ?PROCEDURE:  Procedure(s): ?DILATATION & CURETTAGE/HYSTEROSCOPY WITH MYOSURE ? ?SURGEON:  Surgeon(s): ?Brien Few, MD ? ?ASSISTANTS: none  ? ?ANESTHESIA:   local and general ? ?ESTIMATED BLOOD LOSS: 5 mL  ? ?DRAINS: none  ? ?LOCAL MEDICATIONS USED:  MARCAINE    and Amount: 20 ml ? ?SPECIMEN:  Source of Specimen:  emc and polyp ? ?DISPOSITION OF SPECIMEN:  PATHOLOGY ? ?COUNTS:  YES ? ?DICTATION # [id number] ? ?PLAN OF CARE: dc home ? ?PATIENT DISPOSITION:  PACU - hemodynamically stable.  ? ? ? ? ? ? ? ? ? ? ? ? ?  ?

## 2021-09-17 NOTE — Transfer of Care (Signed)
Immediate Anesthesia Transfer of Care Note ? ?Patient: Shelly Lewis ? ?Procedure(s) Performed: Procedure(s) (LRB): ?DILATATION & CURETTAGE/HYSTEROSCOPY WITH MYOSURE (N/A) ? ?Patient Location: PACU ? ?Anesthesia Type: General ? ?Level of Consciousness: awake, alert  and oriented ? ?Airway & Oxygen Therapy: Patient Spontanous Breathing  ? ?Post-op Assessment: Report given to PACU RN and Post -op Vital signs reviewed and stable ? ?Post vital signs: Reviewed and stable ? ?Complications: No apparent anesthesia complications ? ?Last Vitals:  ?Vitals Value Taken Time  ?BP 131/76 09/17/21 1145  ?Temp 36.6 ?C 09/17/21 1142  ?Pulse 82 09/17/21 1155  ?Resp 8 09/17/21 1155  ?SpO2 92 % 09/17/21 1155  ?Vitals shown include unvalidated device data. ? ?Last Pain:  ?Vitals:  ? 09/17/21 1144  ?TempSrc:   ?PainSc: 0-No pain  ?   ? ?Patients Stated Pain Goal: 6 (09/17/21 7482) ? ?Complications: No notable events documented. ?

## 2021-09-17 NOTE — Op Note (Signed)
NAME: Shelly Lewis, Shelly Lewis ?MEDICAL RECORD NO: [id number] ?ACCOUNT NO: [id number] ?DATE OF BIRTH: [birth year] ?FACILITY: [facility] ?LOCATION: [facility] ?PHYSICIAN: Lovenia Kim, MD ? ?Operative Report  ? ?DATE OF PROCEDURE: 09/17/2021 ? ?PREOPERATIVE DIAGNOSIS:  Menometrorrhagia with probable structural defect. ? ?POSTOPERATIVE DIAGNOSIS:  Endometrial polyp. ? ?PROCEDURE:  Diagnostic hysteroscopy, D and C. ? ?SURGEON:  Lovenia Kim, MD ? ?ASSISTANT:  None. ? ?ANESTHESIA:  Local and general. ? ?ESTIMATED BLOOD LOSS:  Less than 50 mL. ? ?COMPLICATIONS:  None. ? ?FLUID DEFICIT:  75 mL. ? ?DISPOSITION:  The patient to recovery in good condition. ? ?BRIEF OPERATIVE NOTE:  After being apprised of the risks of anesthesia, infection, bleeding, injury to surrounding organs, possible need for repair, delayed versus immediate complications including bowel and bladder injury, possible need for repair,  ?consent signed.  The patient was brought to the operating room where she was administered general anesthetic without complications.  Prepped and draped in usual sterile fashion.  Feet were placed in [place].  Exam under anesthesia reveals a  ?small anteflexed uterus and no adnexal masses.  Dilute Pitressin solution placed at 3 and 9 o'clock, 20 mL total and dilute paracervical block, 20 mL total, 0.25% Marcaine in the standard fashion.  Cervix easily dilated up to a #19 Pratt dilator.   ?Hysteroscope placed.  Visualization reveals 2 posterior wall endometrial polyps which were resected using the MyoSure LITE without difficulty.  D and C was then performed with a sharp curettage in a 4-quadrant method.  Exploration of the cavity reveals  ?no other evidence of intracavitary defects.  Normal tubal ostia.  No evidence of perforation.  At this time, minimal bleeding noted.  Fluid deficit as noted.  The patient tolerated the procedure well.  All instruments were removed.  The patient  ?transferred to recovery in good  condition. ? ? ?NIK ?D: 09/17/2021 12:01:16 pm T: 09/17/2021 11:14:00 pm  ?JOB: [id number]/ [id number]  ?

## 2021-09-18 ENCOUNTER — Encounter (HOSPITAL_BASED_OUTPATIENT_CLINIC_OR_DEPARTMENT_OTHER): Payer: Self-pay | Admitting: Obstetrics and Gynecology

## 2021-09-18 LAB — SURGICAL PATHOLOGY

## 2021-09-18 NOTE — Anesthesia Postprocedure Evaluation (Signed)
Anesthesia Post Note ? ?Patient: KINZEE HAPPEL ? ?Procedure(s) Performed: [place] ? ?  ? ?Patient location during evaluation: PACU ?Anesthesia Type: General ?Level of consciousness: awake and alert ?Pain management: pain level controlled ?Vital Signs Assessment: post-procedure vital signs reviewed and stable ?Respiratory status: spontaneous breathing, nonlabored ventilation, respiratory function stable and patient connected to nasal cannula oxygen ?Cardiovascular status: blood pressure returned to baseline and stable ?Postop Assessment: no apparent nausea or vomiting ?Anesthetic complications: no ? ? ?No notable events documented. ? ?Last Vitals:  ?Vitals:  ? 09/17/21 1200 09/17/21 1215  ?BP: 130/85 135/84  ?Pulse: 76 73  ?Resp: 10 12  ?Temp:  36.6 ?C  ?SpO2: 96% 94%  ?  ?Last Pain:  ?Vitals:  ? 09/18/21 0925  ?TempSrc:   ?PainSc: 2   ? ? ?  ?  ?  ?  ?  ?  ? ?Dominika Losey L Evagelia Knack ? ? ? ? ?

## 2021-09-25 DIAGNOSIS — N939 Abnormal uterine and vaginal bleeding, unspecified: Secondary | ICD-10-CM | POA: Diagnosis not present

## 2021-09-30 ENCOUNTER — Other Ambulatory Visit: Payer: Self-pay | Admitting: Neurology

## 2021-10-08 ENCOUNTER — Other Ambulatory Visit: Payer: Self-pay | Admitting: Physician Assistant

## 2021-10-10 DIAGNOSIS — L814 Other melanin hyperpigmentation: Secondary | ICD-10-CM | POA: Diagnosis not present

## 2021-10-10 DIAGNOSIS — L578 Other skin changes due to chronic exposure to nonionizing radiation: Secondary | ICD-10-CM | POA: Diagnosis not present

## 2021-10-10 DIAGNOSIS — D1801 Hemangioma of skin and subcutaneous tissue: Secondary | ICD-10-CM | POA: Diagnosis not present

## 2021-10-10 DIAGNOSIS — L821 Other seborrheic keratosis: Secondary | ICD-10-CM | POA: Diagnosis not present

## 2021-10-17 DIAGNOSIS — F411 Generalized anxiety disorder: Secondary | ICD-10-CM | POA: Diagnosis not present

## 2021-10-17 DIAGNOSIS — F332 Major depressive disorder, recurrent severe without psychotic features: Secondary | ICD-10-CM | POA: Diagnosis not present

## 2022-01-11 ENCOUNTER — Ambulatory Visit: Payer: BC Managed Care – PPO | Admitting: Physician Assistant

## 2022-01-11 ENCOUNTER — Encounter: Payer: Self-pay | Admitting: Physician Assistant

## 2022-01-11 VITALS — BP 115/83 | HR 76 | Temp 97.9°F | Ht 62.0 in | Wt 164.4 lb

## 2022-01-11 DIAGNOSIS — R351 Nocturia: Secondary | ICD-10-CM

## 2022-01-11 DIAGNOSIS — E1165 Type 2 diabetes mellitus with hyperglycemia: Secondary | ICD-10-CM | POA: Diagnosis not present

## 2022-01-11 DIAGNOSIS — E782 Mixed hyperlipidemia: Secondary | ICD-10-CM

## 2022-01-11 DIAGNOSIS — Z0000 Encounter for general adult medical examination without abnormal findings: Secondary | ICD-10-CM

## 2022-01-11 LAB — POC URINALSYSI DIPSTICK (AUTOMATED)
Bilirubin, UA: NEGATIVE
Blood, UA: NEGATIVE
Glucose, UA: NEGATIVE
Ketones, UA: NEGATIVE
Nitrite, UA: NEGATIVE
Protein, UA: NEGATIVE
Spec Grav, UA: 1.015 (ref 1.010–1.025)
Urobilinogen, UA: 0.2 E.U./dL
pH, UA: 6 (ref 5.0–8.0)

## 2022-01-11 LAB — CBC WITH DIFFERENTIAL/PLATELET
Basophils Absolute: 0 10*3/uL (ref 0.0–0.1)
Basophils Relative: 0.5 % (ref 0.0–3.0)
Eosinophils Absolute: 0.2 10*3/uL (ref 0.0–0.7)
Eosinophils Relative: 2.6 % (ref 0.0–5.0)
HCT: 38.9 % (ref 36.0–46.0)
Hemoglobin: 12.8 g/dL (ref 12.0–15.0)
Lymphocytes Relative: 31 % (ref 12.0–46.0)
Lymphs Abs: 2 10*3/uL (ref 0.7–4.0)
MCHC: 32.9 g/dL (ref 30.0–36.0)
MCV: 86.5 fl (ref 78.0–100.0)
Monocytes Absolute: 0.4 10*3/uL (ref 0.1–1.0)
Monocytes Relative: 6.8 % (ref 3.0–12.0)
Neutro Abs: 3.8 10*3/uL (ref 1.4–7.7)
Neutrophils Relative %: 59.1 % (ref 43.0–77.0)
Platelets: 308 10*3/uL (ref 150.0–400.0)
RBC: 4.5 Mil/uL (ref 3.87–5.11)
RDW: 13.2 % (ref 11.5–15.5)
WBC: 6.4 10*3/uL (ref 4.0–10.5)

## 2022-01-11 LAB — LDL CHOLESTEROL, DIRECT: Direct LDL: 209 mg/dL

## 2022-01-11 LAB — COMPREHENSIVE METABOLIC PANEL
ALT: 16 U/L (ref 0–35)
AST: 17 U/L (ref 0–37)
Albumin: 4.4 g/dL (ref 3.5–5.2)
Alkaline Phosphatase: 54 U/L (ref 39–117)
BUN: 18 mg/dL (ref 6–23)
CO2: 24 mEq/L (ref 19–32)
Calcium: 9 mg/dL (ref 8.4–10.5)
Chloride: 103 mEq/L (ref 96–112)
Creatinine, Ser: 0.82 mg/dL (ref 0.40–1.20)
GFR: 84.7 mL/min (ref >60.00)
Glucose, Bld: 137 mg/dL — ABNORMAL HIGH (ref 70–99)
Potassium: 4.6 mEq/L (ref 3.5–5.1)
Sodium: 136 mEq/L (ref 135–145)
Total Bilirubin: 0.4 mg/dL (ref 0.2–1.2)
Total Protein: 6.6 g/dL (ref 6.0–8.3)

## 2022-01-11 LAB — LIPID PANEL
Cholesterol: 286 mg/dL — ABNORMAL HIGH (ref 0–200)
HDL: 40.7 mg/dL (ref >39.00)
NonHDL: 245.58
Total CHOL/HDL Ratio: 7
Triglycerides: 248 mg/dL — ABNORMAL HIGH (ref 0.0–149.0)
VLDL: 49.6 mg/dL — ABNORMAL HIGH (ref 0.0–40.0)

## 2022-01-11 LAB — MICROALBUMIN / CREATININE URINE RATIO
Creatinine,U: 49.1 mg/dL
Microalb Creat Ratio: 1.4 mg/g (ref 0.0–30.0)
Microalb, Ur: <0.7 mg/dL (ref 0.0–1.9)

## 2022-01-11 LAB — HEMOGLOBIN A1C: Hgb A1c MFr Bld: 6.7 % — ABNORMAL HIGH (ref 4.6–6.5)

## 2022-01-11 NOTE — Patient Instructions (Addendum)
Good to see you!! Keep up the great work! Labs today   Look into Oxybutynin or Myrbetriq for overactive bladder - let me know if you might want to start one of these

## 2022-01-11 NOTE — Progress Notes (Signed)
Subjective:    Patient ID: Shelly Lewis, female    DOB: 1975/09/16, 46 y.o.   MRN: [id number]  Chief Complaint  Patient presents with   Annual Exam    Pt in for annual CPE and fasting for labs; no concerns to discuss; still struggling with weight more than she was hoping for with managing glucose etc;     HPI Patient is in today for annual exam and recheck of diabetes.  Acute concerns: Overactive bladder - says she saw urology a few years ago and was told to work on [employer] essentially  Health maintenance: Lifestyle/ exercise: Walking, arm exercises, weights Nutrition: Well balanced overall  Mental health: Doing well Caffeine: 1-2 cups coffee in the mornings  Sleep: Interrupted secondary to nocturia  Substance use: None  ETOH: Occasional wine in the evenings Sexual activity: Monogamous, no concerns  Immunizations: UTD, Flu shot this fall  Colonoscopy: Cologuard UTD Pap: Dr. Billy Coast Mammogram: UTD  D&C on 09/17/21 - polyp removed, benign     Past Medical History:  Diagnosis Date   Allergy    Anxiety    Asthma    Blood transfusion without reported diagnosis    Diabetes mellitus without complication (HCC)    Dizziness    Family history of premature coronary artery disease 08/22/2015   GERD (gastroesophageal reflux disease)    Headache    Hyperlipidemia 08/22/2015   IBS (irritable bowel syndrome)    PONV (postoperative nausea and vomiting)    Thyroid disease    Hypothyroidism    Past Surgical History:  Procedure Laterality Date   DILATATION & CURETTAGE/HYSTEROSCOPY WITH MYOSURE N/A 09/17/2021   Procedure: DILATATION & CURETTAGE/HYSTEROSCOPY WITH MYOSURE;  Surgeon: Olivia Mackie, MD;  Location: [facility];  Service: Gynecology;  Laterality: N/A;   ECTOPIC PREGNANCY SURGERY     ELBOW FRACTURE SURGERY Left 01/2021   MIDDLE EAR SURGERY     TONSILLECTOMY      Family History  Problem Relation Age of Onset   Hyperlipidemia Mother    Depression  Mother    Diabetes Father    Hyperlipidemia Father    Cancer Maternal Grandmother    Heart disease Maternal Grandmother    Heart disease Paternal Grandmother    Hyperlipidemia Paternal Grandfather    Stroke Paternal Grandfather     Social History   Tobacco Use   Smoking status: Never   Smokeless tobacco: Never  Substance Use Topics   Alcohol use: Yes    Alcohol/week: 4.0 standard drinks of alcohol    Types: 4 Glasses of wine per week    Comment: 2 glasses wine per week   Drug use: No     Allergies  Allergen Reactions   Mold Extract [Trichophyton] Other (See Comments)    congestion   Gluten Meal Diarrhea    Migraines, Reflux   Milk (Cow) Diarrhea    Review of Systems NEGATIVE UNLESS OTHERWISE INDICATED IN HPI      Objective:     BP 115/83 (BP Location: Left Arm)   Pulse 76   Temp 97.9 F (36.6 C) (Temporal)   Ht 5\' 2"  (1.575 m)   Wt 164 lb 6.4 oz (74.6 kg)   LMP 12/26/2021 (Approximate)   SpO2 98%   BMI 30.07 kg/m   Wt Readings from Last 3 Encounters:  01/11/22 164 lb 6.4 oz (74.6 kg)  09/17/21 163 lb 9.6 oz (74.2 kg)  08/03/21 161 lb 4 oz (73.1 kg)    BP Readings from  Last 3 Encounters:  01/11/22 115/83  09/17/21 135/84  08/03/21 112/79     Physical Exam Vitals and nursing note reviewed.  Constitutional:      Appearance: Normal appearance. She is normal weight. She is not toxic-appearing.  HENT:     Head: Normocephalic and atraumatic.     Right Ear: Tympanic membrane, ear canal and external ear normal.     Left Ear: Tympanic membrane, ear canal and external ear normal.     Nose: Nose normal.     Mouth/Throat:     Mouth: Mucous membranes are moist.  Eyes:     Extraocular Movements: Extraocular movements intact.     Conjunctiva/sclera: Conjunctivae normal.     Pupils: Pupils are equal, round, and reactive to light.  Cardiovascular:     Rate and Rhythm: Normal rate and regular rhythm.     Pulses: Normal pulses.     Heart sounds: Normal  heart sounds.  Pulmonary:     Effort: Pulmonary effort is normal.     Breath sounds: Normal breath sounds.  Abdominal:     General: Abdomen is flat. Bowel sounds are normal.     Palpations: Abdomen is soft. There is no mass.     Tenderness: There is no abdominal tenderness. There is no right CVA tenderness, left CVA tenderness or guarding.  Musculoskeletal:        General: Normal range of motion.     Cervical back: Normal range of motion and neck supple.  Skin:    General: Skin is warm and dry.     Findings: Lesion (R pre-auricular raised pink lesion, following with derm) present.  Neurological:     General: No focal deficit present.     Mental Status: She is alert and oriented to person, place, and time.  Psychiatric:        Mood and Affect: Mood normal.        Behavior: Behavior normal.        Thought Content: Thought content normal.        Judgment: Judgment normal.        Assessment & Plan:   Problem List Items Addressed This Visit       Other   Hyperlipidemia   Relevant Orders   Lipid panel   Other Visit Diagnoses     Encounter for annual physical exam    -  Primary   Relevant Orders   CBC with Differential/Platelet   Comprehensive metabolic panel   Lipid panel   Hemoglobin A1c   Microalbumin / creatinine urine ratio   POCT Urinalysis Dipstick (Automated)   Type 2 diabetes mellitus with hyperglycemia, without long-term current use of insulin (HCC)       Relevant Orders   Comprehensive metabolic panel   Hemoglobin A1c   Microalbumin / creatinine urine ratio   POCT Urinalysis Dipstick (Automated)   Nocturia       Relevant Orders   POCT Urinalysis Dipstick (Automated)       Plan: Age-appropriate screening and counseling performed today. Will check labs and call with results. Preventive measures discussed and printed in AVS for patient.  -UTD on preventive screenings -Encouraged to work on lifestyle -Doing well with Metformin, cont 500 mg once in the  evenings; recheck labs, microalbumin today -Complaints of nocturia / OAB - asked her to think about oxybutynin and Myrbetriq  Patient Counseling: [x]   Nutrition: Stressed importance of moderation in sodium/caffeine intake, saturated fat and cholesterol, caloric balance, sufficient intake of fresh  fruits, vegetables, and fiber.  [x]   Stressed the importance of regular exercise.   [x]   Substance Abuse: Discussed cessation/primary prevention of tobacco, alcohol, or other drug use; driving or other dangerous activities under the influence; availability of treatment for abuse.   []   Injury prevention: Discussed safety belts, safety helmets, smoke detector, smoking near bedding or upholstery.   []   Sexuality: Discussed sexually transmitted diseases, partner selection, use of condoms, avoidance of unintended pregnancy  and contraceptive alternatives.   [x]   Dental health: Discussed importance of regular tooth brushing, flossing, and dental visits.  [x]   Health maintenance and immunizations reviewed. Please refer to Health maintenance section.     F/up in 6 months or prn  Quavis Klutz M Taher Vannote, PA-C

## 2022-01-14 ENCOUNTER — Ambulatory Visit: Payer: BC Managed Care – PPO | Admitting: Physician Assistant

## 2022-01-18 ENCOUNTER — Other Ambulatory Visit: Payer: Self-pay | Admitting: Physician Assistant

## 2022-01-23 ENCOUNTER — Ambulatory Visit: Payer: BC Managed Care – PPO | Admitting: Dietician

## 2022-01-29 ENCOUNTER — Encounter: Payer: Self-pay | Admitting: Neurology

## 2022-01-29 ENCOUNTER — Ambulatory Visit: Payer: BC Managed Care – PPO | Admitting: Neurology

## 2022-01-29 VITALS — Ht 62.0 in | Wt 169.0 lb

## 2022-01-29 DIAGNOSIS — R42 Dizziness and giddiness: Secondary | ICD-10-CM

## 2022-01-29 DIAGNOSIS — H539 Unspecified visual disturbance: Secondary | ICD-10-CM

## 2022-01-29 MED ORDER — ZONISAMIDE 25 MG PO CAPS: 25.0000 mg | ORAL_CAPSULE | Freq: Every day | ORAL | 5 refills | Status: DC

## 2022-01-29 NOTE — Patient Instructions (Signed)
Stop the Topamax, start Zonegran 25 mg at bedtime, send progress report on my chart in 1-2 weeks, can adjust the dosing if needed See you back in 6 months
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
## 2022-02-08 DIAGNOSIS — F411 Generalized anxiety disorder: Secondary | ICD-10-CM | POA: Diagnosis not present

## 2022-02-08 DIAGNOSIS — F332 Major depressive disorder, recurrent severe without psychotic features: Secondary | ICD-10-CM | POA: Diagnosis not present

## 2022-03-04 ENCOUNTER — Encounter: Payer: Self-pay | Admitting: *Deleted

## 2022-04-20 ENCOUNTER — Other Ambulatory Visit: Payer: Self-pay | Admitting: Physician Assistant

## 2022-05-05 ENCOUNTER — Encounter: Payer: Self-pay | Admitting: Neurology

## 2022-05-07 MED ORDER — TOPIRAMATE ER 25 MG PO CAP24: 25.0000 mg | ORAL_CAPSULE | Freq: Every day | ORAL | 5 refills | Status: DC

## 2022-05-07 NOTE — Addendum Note (Signed)
Addended by: Glean Salvo on: 05/07/2022 03:22 PM   Modules accepted: Orders

## 2022-05-10 DIAGNOSIS — F332 Major depressive disorder, recurrent severe without psychotic features: Secondary | ICD-10-CM | POA: Diagnosis not present

## 2022-05-10 DIAGNOSIS — F411 Generalized anxiety disorder: Secondary | ICD-10-CM | POA: Diagnosis not present

## 2022-07-13 ENCOUNTER — Other Ambulatory Visit: Payer: Self-pay | Admitting: Physician Assistant

## 2022-07-15 ENCOUNTER — Ambulatory Visit: Payer: BC Managed Care – PPO | Admitting: Physician Assistant

## 2022-07-15 ENCOUNTER — Encounter: Payer: Self-pay | Admitting: Physician Assistant

## 2022-07-15 VITALS — BP 128/84 | HR 76 | Temp 97.1°F | Ht 62.0 in | Wt 166.4 lb

## 2022-07-15 DIAGNOSIS — E782 Mixed hyperlipidemia: Secondary | ICD-10-CM

## 2022-07-15 DIAGNOSIS — E0789 Other specified disorders of thyroid: Secondary | ICD-10-CM | POA: Insufficient documentation

## 2022-07-15 DIAGNOSIS — F419 Anxiety disorder, unspecified: Secondary | ICD-10-CM

## 2022-07-15 DIAGNOSIS — F32A Depression, unspecified: Secondary | ICD-10-CM | POA: Insufficient documentation

## 2022-07-15 DIAGNOSIS — E1165 Type 2 diabetes mellitus with hyperglycemia: Secondary | ICD-10-CM

## 2022-07-15 LAB — LIPID PANEL
Cholesterol: 288 mg/dL — ABNORMAL HIGH (ref 0–200)
HDL: 39.5 mg/dL (ref >39.00)
NonHDL: 248.19
Total CHOL/HDL Ratio: 7
Triglycerides: 226 mg/dL — ABNORMAL HIGH (ref 0.0–149.0)
VLDL: 45.2 mg/dL — ABNORMAL HIGH (ref 0.0–40.0)

## 2022-07-15 LAB — COMPREHENSIVE METABOLIC PANEL
ALT: 17 U/L (ref 0–35)
AST: 15 U/L (ref 0–37)
Albumin: 4.2 g/dL (ref 3.5–5.2)
Alkaline Phosphatase: 49 U/L (ref 39–117)
BUN: 17 mg/dL (ref 6–23)
CO2: 27 mEq/L (ref 19–32)
Calcium: 9 mg/dL (ref 8.4–10.5)
Chloride: 100 mEq/L (ref 96–112)
Creatinine, Ser: 0.91 mg/dL (ref 0.40–1.20)
GFR: 74.48 mL/min (ref >60.00)
Glucose, Bld: 139 mg/dL — ABNORMAL HIGH (ref 70–99)
Potassium: 4.3 mEq/L (ref 3.5–5.1)
Sodium: 137 mEq/L (ref 135–145)
Total Bilirubin: 0.4 mg/dL (ref 0.2–1.2)
Total Protein: 6.4 g/dL (ref 6.0–8.3)

## 2022-07-15 LAB — TSH: TSH: 2 u[IU]/mL (ref 0.35–5.50)

## 2022-07-15 LAB — LDL CHOLESTEROL, DIRECT: Direct LDL: 220 mg/dL

## 2022-07-15 LAB — HEMOGLOBIN A1C: Hgb A1c MFr Bld: 7.1 % — ABNORMAL HIGH (ref 4.6–6.5)

## 2022-07-15 NOTE — Progress Notes (Signed)
Subjective:    Patient ID: Shelly Lewis, female    DOB: [birth year], 47 y.o.   MRN: [id number]  Chief Complaint  Patient presents with   Follow-up    Pt in office for 6 mon f/u; pt in for A1C check and states is fasting for labs; no concerns to discuss;     HPI Patient is in today for 6 month follow-up. See A/P for details.   Past Medical History:  Diagnosis Date   Allergy    Anxiety    Asthma    Blood transfusion without reported diagnosis    Diabetes mellitus without complication ([place])    Dizziness    Family history of premature coronary artery disease 08/22/2015   GERD (gastroesophageal reflux disease)    Headache    Hyperlipidemia 08/22/2015   IBS (irritable bowel syndrome)    PONV (postoperative nausea and vomiting)    Thyroid disease    Hypothyroidism    Past Surgical History:  Procedure Laterality Date   DILATATION & CURETTAGE/HYSTEROSCOPY WITH MYOSURE N/A 09/17/2021   Procedure: DILATATION & CURETTAGE/HYSTEROSCOPY WITH MYOSURE;  Surgeon: Brien Few, MD;  Location: [facility];  Service: Gynecology;  Laterality: N/A;   ECTOPIC PREGNANCY SURGERY     ELBOW FRACTURE SURGERY Left 01/2021   MIDDLE EAR SURGERY     TONSILLECTOMY      Family History  Problem Relation Age of Onset   Hyperlipidemia Mother    Depression Mother    Diabetes Father    Hyperlipidemia Father    Cancer Maternal Grandmother    Heart disease Maternal Grandmother    Heart disease Paternal Grandmother    Hyperlipidemia Paternal Grandfather    Stroke Paternal Grandfather     Social History   Tobacco Use   Smoking status: Never   Smokeless tobacco: Never  Substance Use Topics   Alcohol use: Yes    Alcohol/week: 4.0 standard drinks of alcohol    Types: 4 Glasses of wine per week    Comment: 2 glasses wine per week   Drug use: No     Allergies  Allergen Reactions   Mold Extract [Trichophyton] Other (See Comments)    congestion   Gluten Meal Diarrhea     Migraines, Reflux   Milk (Cow) Diarrhea    Review of Systems NEGATIVE UNLESS OTHERWISE INDICATED IN HPI      Objective:     BP 128/84 (BP Location: Left Arm)   Pulse 76   Temp (!) 97.1 F (36.2 C) (Temporal)   Ht 5\' 2"  (1.575 m)   Wt 166 lb 6.4 oz (75.5 kg)   LMP 07/10/2022 (Exact Date)   SpO2 98%   BMI 30.43 kg/m   Wt Readings from Last 3 Encounters:  07/15/22 166 lb 6.4 oz (75.5 kg)  01/29/22 169 lb (76.7 kg)  01/11/22 164 lb 6.4 oz (74.6 kg)    BP Readings from Last 3 Encounters:  07/15/22 128/84  01/11/22 115/83  09/17/21 135/84     Physical Exam Vitals and nursing note reviewed.  Constitutional:      Appearance: Normal appearance.  Eyes:     Extraocular Movements: Extraocular movements intact.     Conjunctiva/sclera: Conjunctivae normal.     Pupils: Pupils are equal, round, and reactive to light.  Cardiovascular:     Rate and Rhythm: Normal rate and regular rhythm.     Pulses: Normal pulses.     Heart sounds: No murmur heard. Pulmonary:     Effort:  Pulmonary effort is normal.     Breath sounds: Normal breath sounds.  Skin:    Findings: No lesion or rash.  Neurological:     General: No focal deficit present.     Mental Status: She is alert and oriented to person, place, and time.  Psychiatric:        Mood and Affect: Mood normal.        Behavior: Behavior normal.        Assessment & Plan:  Type 2 diabetes mellitus with hyperglycemia, without long-term current use of insulin (HCC) Assessment & Plan: Labs today Exercise / nutrition - continue to work on this UTD with screenings Cont Metformin XR 500 mg at night   Orders: -     Comprehensive metabolic panel -     Hemoglobin A1c  Mixed hyperlipidemia Assessment & Plan: Lipid panel today Being diabetic, need to consider starting statin therapy  Orders: -     Lipid panel  Other specified disorders of thyroid Assessment & Plan: Used to be on thyroid medication Recheck TSH  today  Orders: -     TSH  Anxiety and depression Assessment & Plan: Stable on Lexapro 20 mg and Buspar 7.5 mg          Return in about 6 months (around 01/13/2023) for Med recheck.     Jordyn Hofacker M Janelie Goltz, PA-C

## 2022-07-15 NOTE — Assessment & Plan Note (Signed)
Labs today Exercise / nutrition - continue to work on this UTD with screenings Cont Metformin XR 500 mg at night

## 2022-07-15 NOTE — Assessment & Plan Note (Signed)
Lipid panel today Being diabetic, need to consider starting statin therapy

## 2022-07-15 NOTE — Assessment & Plan Note (Signed)
Stable on Lexapro 20 mg and Buspar 7.5 mg

## 2022-07-15 NOTE — Patient Instructions (Signed)
Great to see you! Keep up the good work.

## 2022-07-15 NOTE — Assessment & Plan Note (Signed)
Used to be on thyroid medication Recheck TSH today

## 2022-07-16 ENCOUNTER — Encounter: Payer: Self-pay | Admitting: Physician Assistant

## 2022-07-16 NOTE — Telephone Encounter (Signed)
Pt willing to increase Metformin so sending in updated Rx, please advise dose of Lipitor to Rx as well. Thanks

## 2022-07-17 ENCOUNTER — Other Ambulatory Visit: Payer: Self-pay | Admitting: Physician Assistant

## 2022-07-17 MED ORDER — ATORVASTATIN CALCIUM 10 MG PO TABS: 10.0000 mg | ORAL_TABLET | Freq: Every evening | ORAL | 1 refills | Status: DC

## 2022-07-19 ENCOUNTER — Other Ambulatory Visit: Payer: Self-pay | Admitting: Physician Assistant
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
## 2022-08-07 ENCOUNTER — Encounter: Payer: Self-pay | Admitting: Neurology

## 2022-08-07 ENCOUNTER — Telehealth (INDEPENDENT_AMBULATORY_CARE_PROVIDER_SITE_OTHER): Payer: BC Managed Care – PPO | Admitting: Neurology

## 2022-08-07 DIAGNOSIS — G43709 Chronic migraine without aura, not intractable, without status migrainosus: Secondary | ICD-10-CM | POA: Diagnosis not present

## 2022-08-07 DIAGNOSIS — H900 Conductive hearing loss, bilateral: Secondary | ICD-10-CM | POA: Diagnosis not present

## 2022-08-07 DIAGNOSIS — Z9622 Myringotomy tube(s) status: Secondary | ICD-10-CM | POA: Diagnosis not present

## 2022-08-07 DIAGNOSIS — H906 Mixed conductive and sensorineural hearing loss, bilateral: Secondary | ICD-10-CM | POA: Diagnosis not present

## 2022-08-07 MED ORDER — SUMATRIPTAN SUCCINATE 50 MG PO TABS: 50.0000 mg | ORAL_TABLET | ORAL | 5 refills | Status: DC | PRN

## 2022-08-07 MED ORDER — AJOVY 225 MG/1.5ML ~~LOC~~ SOAJ
225.0000 mg | SUBCUTANEOUS | 11 refills | Status: DC
Start: 2022-08-07 — End: 2022-08-29

## 2022-08-07 MED ORDER — ONDANSETRON 4 MG PO TBDP: 4.0000 mg | ORAL_TABLET | Freq: Three times a day (TID) | ORAL | 1 refills | Status: DC | PRN

## 2022-08-07 NOTE — Patient Instructions (Signed)
We will start the Ajovy for migraine prevention  Use Imitrex, Zofran, Aleve for acute headache treatment  I would give the Ajovy 3-4 months to see full benefit

## 2022-08-12 DIAGNOSIS — F411 Generalized anxiety disorder: Secondary | ICD-10-CM | POA: Diagnosis not present

## 2022-08-12 DIAGNOSIS — F332 Major depressive disorder, recurrent severe without psychotic features: Secondary | ICD-10-CM | POA: Diagnosis not present

## 2022-08-20 ENCOUNTER — Telehealth: Payer: Self-pay

## 2022-08-20 NOTE — Telephone Encounter (Signed)
Ajovy PA needed

## 2022-08-20 NOTE — Telephone Encounter (Signed)
Patient Advocate Encounter  Prior Authorization for AJOVY (fremanezumab-vfrm) injection '225MG'$ /1.5ML auto-injectors has been approved.    PA# [id number] Key: [id number] Insurance [employer] Electronic PA Form Effective dates: 08/20/2022 through 08/20/2023      Lyndel Safe, [place] Patient Advocate Specialist [place] Patient Advocate Team Direct Number: [phone]  Fax: [phone]

## 2022-08-29 ENCOUNTER — Ambulatory Visit: Payer: BC Managed Care – PPO | Admitting: Physician Assistant

## 2022-08-29 ENCOUNTER — Encounter: Payer: Self-pay | Admitting: Physician Assistant

## 2022-08-29 VITALS — BP 126/84 | HR 88 | Temp 97.8°F | Ht 62.0 in | Wt 167.2 lb

## 2022-08-29 DIAGNOSIS — E782 Mixed hyperlipidemia: Secondary | ICD-10-CM

## 2022-08-29 DIAGNOSIS — E1165 Type 2 diabetes mellitus with hyperglycemia: Secondary | ICD-10-CM

## 2022-08-29 MED ORDER — METFORMIN HCL ER 500 MG PO TB24: 1000.0000 mg | ORAL_TABLET | Freq: Two times a day (BID) | ORAL | 1 refills | Status: DC

## 2022-08-29 NOTE — Progress Notes (Signed)
Subjective:    Patient ID: Shelly Lewis, female    DOB: 09-15-1975, 47 y.o.   MRN: [id number]  Chief Complaint  Patient presents with   Referral    Pt needing referral to Endo; pt states blood sugar is not in range it needs to e after increasing Metformin, diabetes is going to be a lifelong challenge for pt and wants to start seeing a specialist.    HPI Patient is in today for discussion about T2DM and HLD.  Taking Metformin 500 mg BID and atorvastatin 10 mg daily. No issues with medications. Fasting glucose in 160s - 170s. Requesting referral to endo; she is worried about long-term consequences of diabetes.     Past Medical History:  Diagnosis Date   Allergy    Anxiety    Asthma    Blood transfusion without reported diagnosis    Diabetes mellitus without complication ([place])    Dizziness    Family history of premature coronary artery disease 08/22/2015   GERD (gastroesophageal reflux disease)    Headache    Hyperlipidemia 08/22/2015   IBS (irritable bowel syndrome)    PONV (postoperative nausea and vomiting)    Thyroid disease    Hypothyroidism    Past Surgical History:  Procedure Laterality Date   DILATATION & CURETTAGE/HYSTEROSCOPY WITH MYOSURE N/A 09/17/2021   Procedure: DILATATION & CURETTAGE/HYSTEROSCOPY WITH MYOSURE;  Surgeon: Brien Few, MD;  Location: [place] [facility];  Service: Gynecology;  Laterality: N/A;   ECTOPIC PREGNANCY SURGERY     ELBOW FRACTURE SURGERY Left 01/2021   MIDDLE EAR SURGERY     TONSILLECTOMY      Family History  Problem Relation Age of Onset   Hyperlipidemia Mother    Depression Mother    Diabetes Father    Hyperlipidemia Father    Cancer Maternal Grandmother    Heart disease Maternal Grandmother    Heart disease Paternal Grandmother    Hyperlipidemia Paternal Grandfather    Stroke Paternal Grandfather     Social History   Tobacco Use   Smoking status: Never   Smokeless tobacco: Never  Substance Use Topics    Alcohol use: Yes    Alcohol/week: 4.0 standard drinks of alcohol    Types: 4 Glasses of wine per week    Comment: 2 glasses wine per week   Drug use: No     Allergies  Allergen Reactions   Mold Extract [Trichophyton] Other (See Comments)    congestion   Gluten Meal Diarrhea    Migraines, Reflux   Milk (Cow) Diarrhea    Review of Systems NEGATIVE UNLESS OTHERWISE INDICATED IN HPI      Objective:     BP 126/84 (BP Location: Left Arm)   Pulse 88   Temp 97.8 F (36.6 C) (Temporal)   Ht 5\' 2"  (1.575 m)   Wt 167 lb 3.2 oz (75.8 kg)   SpO2 96%   BMI 30.58 kg/m   Wt Readings from Last 3 Encounters:  08/29/22 167 lb 3.2 oz (75.8 kg)  07/15/22 166 lb 6.4 oz (75.5 kg)  01/29/22 169 lb (76.7 kg)    BP Readings from Last 3 Encounters:  08/29/22 126/84  07/15/22 128/84  01/11/22 115/83     Physical Exam Vitals and nursing note reviewed.  Constitutional:      Appearance: Normal appearance.  Cardiovascular:     Rate and Rhythm: Normal rate and regular rhythm.     Pulses: Normal pulses.  Pulmonary:  Effort: Pulmonary effort is normal.     Breath sounds: Normal breath sounds.  Musculoskeletal:     Right lower leg: No edema.     Left lower leg: No edema.  Neurological:     General: No focal deficit present.     Mental Status: She is alert and oriented to person, place, and time.     Comments: Monofilament normal bilat feet  Psychiatric:        Mood and Affect: Mood normal.        Behavior: Behavior normal.        Assessment & Plan:  Type 2 diabetes mellitus with hyperglycemia, without long-term current use of insulin (HCC) Assessment & Plan: Lab Results  Component Value Date   HGBA1C 7.1 (H) 07/15/2022   Reassured pt that there is still room to improve on metformin dosing. Will increase to Metformin ER to 500 mg am, 1000 mg qpm; then 1000 mg BID after a few weeks. Pt aware of risks vs benefits and possible adverse reactions. Keep working on  lifestyle. Referral to endocrinology per patient request.  Offered to continue to work with her on this in the interim.   Orders: -     Ambulatory referral to Endocrinology  Mixed hyperlipidemia Assessment & Plan: Atorvastatin 10 mg daily, doing well so far, no side effects. F/up as scheduled this year.   The 10-year ASCVD risk score (Arnett DK, et al., 2019) is: 5.2%   Values used to calculate the score:     Age: 7 years     Sex: Female     Is Non-Hispanic African American: No     Diabetic: Yes     Tobacco smoker: No     Systolic Blood Pressure: 123XX123 mmHg     Is BP treated: No     HDL Cholesterol: 39.5 mg/dL     Total Cholesterol: 288 mg/dL    Other orders -     metFORMIN HCl ER; Take 2 tablets (1,000 mg total) by mouth 2 (two) times daily with a meal.  Dispense: 360 tablet; Refill: 1        Return as scheduled.  This note was prepared with assistance of Systems analyst. Occasional wrong-word or sound-a-like substitutions may have occurred due to the inherent limitations of voice recognition software.      Ireoluwa Gorsline M Kinzey Sheriff, PA-C

## 2022-08-29 NOTE — Assessment & Plan Note (Signed)
Atorvastatin 10 mg daily, doing well so far, no side effects. F/up as scheduled this year.   The 10-year ASCVD risk score (Arnett DK, et al., 2019) is: 5.2%   Values used to calculate the score:     Age: 47 years     Sex: Female     Is Non-Hispanic African American: No     Diabetic: Yes     Tobacco smoker: No     Systolic Blood Pressure: 123XX123 mmHg     Is BP treated: No     HDL Cholesterol: 39.5 mg/dL     Total Cholesterol: 288 mg/dL

## 2022-08-29 NOTE — Assessment & Plan Note (Signed)
Lab Results  Component Value Date   HGBA1C 7.1 (H) 07/15/2022   Reassured pt that there is still room to improve on metformin dosing. Will increase to Metformin ER to 500 mg am, 1000 mg qpm; then 1000 mg BID after a few weeks. Pt aware of risks vs benefits and possible adverse reactions. Keep working on lifestyle. Referral to endocrinology per patient request.  Offered to continue to work with her on this in the interim.

## 2022-09-12 DIAGNOSIS — Z1231 Encounter for screening mammogram for malignant neoplasm of breast: Secondary | ICD-10-CM | POA: Diagnosis not present

## 2022-09-12 DIAGNOSIS — Z01419 Encounter for gynecological examination (general) (routine) without abnormal findings: Secondary | ICD-10-CM | POA: Diagnosis not present

## 2022-09-13 LAB — HM MAMMOGRAPHY

## 2022-09-29 ENCOUNTER — Other Ambulatory Visit: Payer: Self-pay | Admitting: Physician Assistant

## 2022-10-15 DIAGNOSIS — L578 Other skin changes due to chronic exposure to nonionizing radiation: Secondary | ICD-10-CM | POA: Diagnosis not present

## 2022-10-15 DIAGNOSIS — L821 Other seborrheic keratosis: Secondary | ICD-10-CM | POA: Diagnosis not present

## 2022-10-15 DIAGNOSIS — D1801 Hemangioma of skin and subcutaneous tissue: Secondary | ICD-10-CM | POA: Diagnosis not present

## 2022-10-15 DIAGNOSIS — L814 Other melanin hyperpigmentation: Secondary | ICD-10-CM | POA: Diagnosis not present

## 2022-10-22 ENCOUNTER — Other Ambulatory Visit: Payer: Self-pay

## 2022-10-22 DIAGNOSIS — E1165 Type 2 diabetes mellitus with hyperglycemia: Secondary | ICD-10-CM

## 2022-10-24 ENCOUNTER — Other Ambulatory Visit (INDEPENDENT_AMBULATORY_CARE_PROVIDER_SITE_OTHER): Payer: BC Managed Care – PPO

## 2022-10-24 DIAGNOSIS — E1165 Type 2 diabetes mellitus with hyperglycemia: Secondary | ICD-10-CM

## 2022-10-24 LAB — COMPREHENSIVE METABOLIC PANEL
ALT: 25 U/L (ref 0–35)
AST: 20 U/L (ref 0–37)
Albumin: 4.4 g/dL (ref 3.5–5.2)
Alkaline Phosphatase: 58 U/L (ref 39–117)
BUN: 14 mg/dL (ref 6–23)
CO2: 27 mEq/L (ref 19–32)
Calcium: 9.2 mg/dL (ref 8.4–10.5)
Chloride: 100 mEq/L (ref 96–112)
Creatinine, Ser: 0.77 mg/dL (ref 0.40–1.20)
GFR: 90.84 mL/min (ref >60.00)
Glucose, Bld: 140 mg/dL — ABNORMAL HIGH (ref 70–99)
Potassium: 4 mEq/L (ref 3.5–5.1)
Sodium: 137 mEq/L (ref 135–145)
Total Bilirubin: 0.5 mg/dL (ref 0.2–1.2)
Total Protein: 6.9 g/dL (ref 6.0–8.3)

## 2022-10-24 LAB — LIPID PANEL
Cholesterol: 189 mg/dL (ref 0–200)
HDL: 39.7 mg/dL (ref >39.00)
LDL Cholesterol: 115 mg/dL — ABNORMAL HIGH (ref 0–99)
NonHDL: 149
Total CHOL/HDL Ratio: 5
Triglycerides: 172 mg/dL — ABNORMAL HIGH (ref 0.0–149.0)
VLDL: 34.4 mg/dL (ref 0.0–40.0)

## 2022-10-24 LAB — MICROALBUMIN / CREATININE URINE RATIO
Creatinine,U: 93.6 mg/dL
Microalb Creat Ratio: 0.7 mg/g (ref 0.0–30.0)
Microalb, Ur: <0.7 mg/dL (ref 0.0–1.9)

## 2022-10-24 LAB — HEMOGLOBIN A1C: Hgb A1c MFr Bld: 7.1 % — ABNORMAL HIGH (ref 4.6–6.5)

## 2022-10-28 ENCOUNTER — Encounter: Payer: Self-pay | Admitting: "Endocrinology

## 2022-10-28 ENCOUNTER — Ambulatory Visit: Payer: BC Managed Care – PPO | Admitting: "Endocrinology

## 2022-10-28 VITALS — BP 130/84 | HR 94 | Ht 62.0 in | Wt 167.8 lb

## 2022-10-28 DIAGNOSIS — Z7984 Long term (current) use of oral hypoglycemic drugs: Secondary | ICD-10-CM

## 2022-10-28 DIAGNOSIS — E1165 Type 2 diabetes mellitus with hyperglycemia: Secondary | ICD-10-CM

## 2022-10-28 DIAGNOSIS — E7800 Pure hypercholesterolemia, unspecified: Secondary | ICD-10-CM

## 2022-10-28 DIAGNOSIS — E6609 Other obesity due to excess calories: Secondary | ICD-10-CM | POA: Diagnosis not present

## 2022-10-28 DIAGNOSIS — Z6830 Body mass index (BMI) 30.0-30.9, adult: Secondary | ICD-10-CM

## 2022-10-28 NOTE — Patient Instructions (Signed)

## 2022-10-28 NOTE — Progress Notes (Signed)
Outpatient Endocrinology Note Altamese Carlyle, MD  10/28/22   Shelly Lewis 1975-12-27 [id number]  Referring Provider: Allwardt, Crist Infante, PA-C Primary Care Provider: Allwardt, Crist Infante, PA-C Reason for consultation: Subjective   Assessment & Plan  Diagnoses and all orders for this visit:  Uncontrolled type 2 diabetes mellitus with hyperglycemia (HCC)  Long term (current) use of oral hypoglycemic drugs  Pure hypercholesterolemia  Class 1 obesity due to excess calories with serious comorbidity and body mass index (BMI) of 30.0 to 30.9 in adult    Diabetes complicated by hyperglycemia Hba1c goal less than 7.0, current Hba1c is 7.1 Will recommend for the following change of medications to: Metformin XR 500 mg 2 pill in morning and 2 at night, with meals  Pt is not interested in GLP-1 for weight loss  No known contraindications to any of above medications  Hyperlipidemia -Last LDL off goal: 220 -on atorvastatin 10 mg QD since 07/2022 -Repeat lipids q3-6 mo with pcp with dose escalation to target LDL<70 -Follow low fat diet and exercise   -Blood pressure goal <140/90 - Microalbumin/creatinine at goal < 30 -not on ACE/ARB -diet changes including salt restriction -limit eating outside -counseled BP targets per standards of diabetes care -Uncontrolled blood pressure can lead to retinopathy, nephropathy and cardiovascular and atherosclerotic heart disease  Discussed lifestyle changes, medical management as well as bariatric surgery Maintain healthy lifestyle including 1200 Cal/day, 30 min of activity/day, avoiding refined/processed/outside food 20 minutes physical activity per day, in continuum or interruptedly through the day  Goals: less than 60 grams of carbohydrate/meal, 1200-1500 Cal/day, 10,0000 steps a day and weight loss of 0.5-1 lb/ wk  Sleep 7-9 hours/day, adapting de-stressing techniques and avoiding medications that lead to weight gain   Reviewed and  counseled on: -A1C target -Blood sugar targets -Complications of uncontrolled diabetes  -Checking blood sugar before meals and bedtime and bring log next visit -All medications with mechanism of action and side effects -Hypoglycemia management: rule of 15's, Glucagon Emergency Kit and medical alert ID -low-carb low-fat plate-method diet -At least 20 minutes of physical activity per day -Annual dilated retinal eye exam and foot exam -compliance and follow up needs -follow up as scheduled or earlier if problem gets worse  Call if blood sugar is less than 70 or consistently above 250    Take a 15 gm snack of carbohydrate at bedtime before you go to sleep if your blood sugar is less than 100.    If you are going to fast after midnight for a test or procedure, ask your physician for instructions on how to reduce/decrease your insulin dose.    Call if blood sugar is less than 70 or consistently above 250  -Treating a low sugar by rule of 15  (15 gms of sugar every 15 min until sugar is more than 70) If you feel your sugar is low, test your sugar to be sure If your sugar is low (less than 70), then take 15 grams of a fast acting Carbohydrate (3-4 glucose tablets or glucose gel or 4 ounces of juice or regular soda) Recheck your sugar 15 min after treating low to make sure it is more than 70 If sugar is still less than 70, treat again with 15 grams of carbohydrate          Don't drive the hour of hypoglycemia  If unconscious/unable to eat or drink by mouth, use glucagon injection or nasal spray baqsimi and call 911. Can repeat  again in 15 min if still unconscious.  No follow-ups on file. Follow with pcp  I have reviewed current medications, nurse's notes, allergies, vital signs, past medical and surgical history, family medical history, and social history for this encounter. Counseled patient on symptoms, examination findings, lab findings, imaging results, treatment decisions and monitoring  and prognosis. The patient understood the recommendations and agrees with the treatment plan. All questions regarding treatment plan were fully answered.  Altamese Geneva, MD  10/28/22    History of Present Illness Shelly Lewis is a 47 y.o. year old female who presents for evaluation of Type 2 diabetes mellitus.  BREEZE BISSETT was first diagnosed in 2023.   Diabetes education +  Home diabetes regimen: Metformin XR 500 mg 1 pill in morning and 2 at night    COMPLICATIONS -  MI/Stroke -  retinopathy, last eye exam 2023 -  neuropathy -  nephropathy  SYMPTOMS REVIEWED - Polyuria - Weight loss + Blurred vision, has changed prescription   BLOOD SUGAR DATA Checks once a morning 123-177 range  Physical Exam  BP 130/84 (BP Location: Left Arm, Patient Position: Sitting, Cuff Size: Normal)   Pulse 94   Ht 5\' 2"  (1.575 m)   Wt 167 lb 12.8 oz (76.1 kg)   SpO2 98%   BMI 30.69 kg/m    Constitutional: well developed, well nourished Head: normocephalic, atraumatic Eyes: sclera anicteric, no redness Neck: supple Lungs: normal respiratory effort Neurology: alert and oriented Skin: dry, no appreciable rashes Musculoskeletal: no appreciable defects Psychiatric: normal mood and affect Diabetic Foot Exam - Simple   No data filed      Current Medications Patient's Medications  New Prescriptions   No medications on file  Previous Medications   AJOVY 225 MG/1.5ML SOAJ    INJECT 225 MG INTO THE SKIN EVERY 30 (THIRTY) DAYS.   ALBUTEROL (VENTOLIN HFA) 108 (90 BASE) MCG/ACT INHALER    Inhale into the lungs every 6 (six) hours as needed for wheezing or shortness of breath.   ATORVASTATIN (LIPITOR) 10 MG TABLET    Take 1 tablet (10 mg total) by mouth every evening.   BACILLUS COAGULANS-INULIN (ALIGN PREBIOTIC-PROBIOTIC PO)    Take by mouth.   BLOOD GLUCOSE METER KIT AND SUPPLIES    Dispense Contour next based on patient and insurance preference. Use up to 2 times daily as directed.  Dx E11.9   BUSPIRONE (BUSPAR) 7.5 MG TABLET    Take 7.5 mg by mouth daily as needed.   CONTOUR NEXT TEST TEST STRIP    USE AS INSTRUCTED   ESCITALOPRAM (LEXAPRO) 20 MG TABLET       FLUTICASONE (FLONASE) 50 MCG/ACT NASAL SPRAY    One spray in each nostril before bedtime.   LANCETS 33G MISC    Use as instructed daily   METFORMIN (GLUCOPHAGE-XR) 500 MG 24 HR TABLET    Take 2 tablets (1,000 mg total) by mouth 2 (two) times daily with a meal.   ONDANSETRON (ZOFRAN-ODT) 4 MG DISINTEGRATING TABLET    Take 1 tablet (4 mg total) by mouth every 8 (eight) hours as needed for nausea or vomiting.  Modified Medications   No medications on file  Discontinued Medications   No medications on file    Allergies Allergies  Allergen Reactions   Mold Extract [Trichophyton] Other (See Comments)    congestion   Gluten Meal Diarrhea    Migraines, Reflux   Milk (Cow) Diarrhea    Past Medical History Past Medical  History:  Diagnosis Date   Allergy    Anxiety    Asthma    Blood transfusion without reported diagnosis    Diabetes mellitus without complication (HCC)    Dizziness    Family history of premature coronary artery disease 08/22/2015   GERD (gastroesophageal reflux disease)    Headache    Hyperlipidemia 08/22/2015   IBS (irritable bowel syndrome)    PONV (postoperative nausea and vomiting)    Thyroid disease    Hypothyroidism    Past Surgical History Past Surgical History:  Procedure Laterality Date   DILATATION & CURETTAGE/HYSTEROSCOPY WITH MYOSURE N/A 09/17/2021   Procedure: DILATATION & CURETTAGE/HYSTEROSCOPY WITH MYOSURE;  Surgeon: Olivia Mackie, MD;  Location: [facility];  Service: Gynecology;  Laterality: N/A;   ECTOPIC PREGNANCY SURGERY     ELBOW FRACTURE SURGERY Left 01/2021   MIDDLE EAR SURGERY     TONSILLECTOMY      Family History family history includes Cancer in her maternal grandmother; Depression in her mother; Diabetes in her father; Heart disease in  her maternal grandmother and paternal grandmother; Hyperlipidemia in her father, mother, and paternal grandfather; Stroke in her paternal grandfather.  Social History Social History   Socioeconomic History   Marital status: Married    Spouse name: Tim   Number of children: 4   Years of education: Graduate degree   Highest education level: Not on file  Occupational History   Not on file  Tobacco Use   Smoking status: Never   Smokeless tobacco: Never  Substance and Sexual Activity   Alcohol use: Yes    Alcohol/week: 4.0 standard drinks of alcohol    Types: 4 Glasses of wine per week    Comment: 2 glasses wine per week   Drug use: No   Sexual activity: Not on file  Other Topics Concern   Not on file  Social History Narrative   Right handed    2 cups per day (coffee), decaf tea, on soda   Social Determinants of Health   Financial Resource Strain: Not on file  Food Insecurity: Not on file  Transportation Needs: Not on file  Physical Activity: Not on file  Stress: Not on file  Social Connections: Not on file  Intimate Partner Violence: Not on file    Lab Results  Component Value Date   HGBA1C 7.1 (H) 10/24/2022   Lab Results  Component Value Date   CHOL 189 10/24/2022   Lab Results  Component Value Date   HDL 39.70 10/24/2022   Lab Results  Component Value Date   LDLCALC 115 (H) 10/24/2022   Lab Results  Component Value Date   TRIG 172.0 (H) 10/24/2022   Lab Results  Component Value Date   CHOLHDL 5 10/24/2022   Lab Results  Component Value Date   CREATININE 0.77 10/24/2022   Lab Results  Component Value Date   GFR 90.84 10/24/2022   Lab Results  Component Value Date   MICROALBUR <0.7 10/24/2022      Component Value Date/Time   NA 137 10/24/2022 0815   K 4.0 10/24/2022 0815   CL 100 10/24/2022 0815   CO2 27 10/24/2022 0815   GLUCOSE 140 (H) 10/24/2022 0815   BUN 14 10/24/2022 0815   CREATININE 0.77 10/24/2022 0815   CALCIUM 9.2 10/24/2022  0815   PROT 6.9 10/24/2022 0815   ALBUMIN 4.4 10/24/2022 0815   AST 20 10/24/2022 0815   ALT 25 10/24/2022 0815   ALKPHOS 58 10/24/2022  0815   BILITOT 0.5 10/24/2022 0815      Latest Ref Rng & Units 10/24/2022    8:15 AM 07/15/2022    8:29 AM 01/11/2022    8:16 AM  BMP  Glucose 70 - 99 mg/dL 161  096  045   BUN 6 - 23 mg/dL 14  17  18    Creatinine 0.40 - 1.20 mg/dL 4.09  8.11  9.14   Sodium 135 - 145 mEq/L 137  137  136   Potassium 3.5 - 5.1 mEq/L 4.0  4.3  4.6   Chloride 96 - 112 mEq/L 100  100  103   CO2 19 - 32 mEq/L 27  27  24    Calcium 8.4 - 10.5 mg/dL 9.2  9.0  9.0        Component Value Date/Time   WBC 6.4 01/11/2022 0816   RBC 4.50 01/11/2022 0816   HGB 12.8 01/11/2022 0816   HCT 38.9 01/11/2022 0816   PLT 308.0 01/11/2022 0816   MCV 86.5 01/11/2022 0816   MCH 28.8 09/17/2021 0919   MCHC 32.9 01/11/2022 0816   RDW 13.2 01/11/2022 0816   LYMPHSABS 2.0 01/11/2022 0816   MONOABS 0.4 01/11/2022 0816   EOSABS 0.2 01/11/2022 0816   BASOSABS 0.0 01/11/2022 0816     Parts of this note may have been dictated using voice recognition software. There may be variances in spelling and vocabulary which are unintentional. Not all errors are proofread. Please notify the Thereasa Parkin if any discrepancies are noted or if the meaning of any statement is not clear.

## 2022-11-12 DIAGNOSIS — F332 Major depressive disorder, recurrent severe without psychotic features: Secondary | ICD-10-CM | POA: Diagnosis not present

## 2022-11-12 DIAGNOSIS — F411 Generalized anxiety disorder: Secondary | ICD-10-CM | POA: Diagnosis not present

## 2022-11-25 ENCOUNTER — Other Ambulatory Visit: Payer: Self-pay | Admitting: Physician Assistant

## 2022-12-23 ENCOUNTER — Ambulatory Visit: Payer: BC Managed Care – PPO | Admitting: Physician Assistant

## 2022-12-23 ENCOUNTER — Ambulatory Visit: Payer: BC Managed Care – PPO | Admitting: Family

## 2022-12-23 ENCOUNTER — Encounter: Payer: Self-pay | Admitting: Physician Assistant

## 2022-12-23 VITALS — BP 110/78 | HR 95 | Temp 97.5°F | Ht 62.0 in | Wt 165.2 lb

## 2022-12-23 DIAGNOSIS — H6122 Impacted cerumen, left ear: Secondary | ICD-10-CM

## 2022-12-23 NOTE — Progress Notes (Signed)
Subjective:    Patient ID: Shelly Lewis, female    DOB: Oct 08, 1975, 47 y.o.   MRN: [id number]  Chief Complaint  Patient presents with   Acute Visit    Left ear pain, has been an issue since Saturday, has had a waxy discharge that occurred Saturday, pain located in the inner ear, feeling full and congested, no prior treatment     HPI Patient is in today for left ear pressure, some waxy discharge. Just started wearing hearing aids in the last few months. No much pain. No other symptoms.   Past Medical History:  Diagnosis Date   Allergy    Anxiety    Asthma    Blood transfusion without reported diagnosis    Diabetes mellitus without complication (HCC)    Dizziness    Family history of premature coronary artery disease 08/22/2015   GERD (gastroesophageal reflux disease)    Headache    Hyperlipidemia 08/22/2015   IBS (irritable bowel syndrome)    PONV (postoperative nausea and vomiting)    Thyroid disease    Hypothyroidism    Past Surgical History:  Procedure Laterality Date   DILATATION & CURETTAGE/HYSTEROSCOPY WITH MYOSURE N/A 09/17/2021   Procedure: DILATATION & CURETTAGE/HYSTEROSCOPY WITH MYOSURE;  Surgeon: Olivia Mackie, MD;  Location: [facility];  Service: Gynecology;  Laterality: N/A;   ECTOPIC PREGNANCY SURGERY     ELBOW FRACTURE SURGERY Left 01/2021   MIDDLE EAR SURGERY     TONSILLECTOMY      Family History  Problem Relation Age of Onset   Hyperlipidemia Mother    Depression Mother    Diabetes Father    Hyperlipidemia Father    Cancer Maternal Grandmother    Heart disease Maternal Grandmother    Heart disease Paternal Grandmother    Hyperlipidemia Paternal Grandfather    Stroke Paternal Grandfather     Social History   Tobacco Use   Smoking status: Never   Smokeless tobacco: Never  Substance Use Topics   Alcohol use: Yes    Alcohol/week: 4.0 standard drinks of alcohol    Types: 4 Glasses of wine per week    Comment: 2 glasses  wine per week   Drug use: No     Allergies  Allergen Reactions   Mold Extract [Trichophyton] Other (See Comments)    congestion   Gluten Meal Diarrhea    Migraines, Reflux   Milk (Cow) Diarrhea    Review of Systems NEGATIVE UNLESS OTHERWISE INDICATED IN HPI      Objective:     BP 110/78 (BP Location: Right Arm, Patient Position: Sitting, Cuff Size: Large)   Pulse 95   Temp (!) 97.5 F (36.4 C)   Ht 5\' 2"  (1.575 m)   Wt 165 lb 3.2 oz (74.9 kg)   SpO2 93%   BMI 30.22 kg/m   Wt Readings from Last 3 Encounters:  12/23/22 165 lb 3.2 oz (74.9 kg)  10/28/22 167 lb 12.8 oz (76.1 kg)  08/29/22 167 lb 3.2 oz (75.8 kg)    BP Readings from Last 3 Encounters:  12/23/22 110/78  10/28/22 130/84  08/29/22 126/84     Physical Exam Vitals and nursing note reviewed.  Constitutional:      Appearance: Normal appearance.  HENT:     Right Ear: Tympanic membrane and ear canal normal. There is no impacted cerumen.     Left Ear: There is impacted cerumen.  Neurological:     Mental Status: She is alert and oriented  to person, place, and time.  Psychiatric:        Mood and Affect: Mood normal.        Assessment & Plan:  Left ear impacted cerumen    Left ear impacted cerumen likely 2/2 to new hearing aids. Will have her follow up with Dr. Jenne Pane at ENT as she has a tube in this ear (cannot irrigate), and wax is too deep to remove with curette. Pt agreeable to call and schedule with them.      Return if symptoms worsen or fail to improve.   Maleeka Sabatino M Ashe Graybeal, PA-C

## 2022-12-24 DIAGNOSIS — H7112 Cholesteatoma of tympanum, left ear: Secondary | ICD-10-CM | POA: Diagnosis not present

## 2022-12-24 DIAGNOSIS — H906 Mixed conductive and sensorineural hearing loss, bilateral: Secondary | ICD-10-CM | POA: Diagnosis not present

## 2022-12-24 DIAGNOSIS — H9212 Otorrhea, left ear: Secondary | ICD-10-CM | POA: Diagnosis not present

## 2022-12-24 DIAGNOSIS — H6122 Impacted cerumen, left ear: Secondary | ICD-10-CM | POA: Diagnosis not present

## 2023-01-10 ENCOUNTER — Institutional Professional Consult (permissible substitution): Payer: BC Managed Care – PPO | Admitting: Plastic Surgery

## 2023-01-12 ENCOUNTER — Other Ambulatory Visit: Payer: Self-pay | Admitting: Physician Assistant

## 2023-01-13 ENCOUNTER — Encounter: Payer: Self-pay | Admitting: Physician Assistant

## 2023-01-13 ENCOUNTER — Ambulatory Visit: Payer: BC Managed Care – PPO | Admitting: Physician Assistant

## 2023-01-13 VITALS — BP 124/80 | HR 71 | Temp 97.5°F | Resp 16 | Ht 62.0 in | Wt 165.0 lb

## 2023-01-13 DIAGNOSIS — F419 Anxiety disorder, unspecified: Secondary | ICD-10-CM

## 2023-01-13 DIAGNOSIS — E1165 Type 2 diabetes mellitus with hyperglycemia: Secondary | ICD-10-CM | POA: Diagnosis not present

## 2023-01-13 DIAGNOSIS — E782 Mixed hyperlipidemia: Secondary | ICD-10-CM

## 2023-01-13 DIAGNOSIS — Z9622 Myringotomy tube(s) status: Secondary | ICD-10-CM | POA: Diagnosis not present

## 2023-01-13 DIAGNOSIS — F32A Depression, unspecified: Secondary | ICD-10-CM

## 2023-01-13 DIAGNOSIS — H906 Mixed conductive and sensorineural hearing loss, bilateral: Secondary | ICD-10-CM | POA: Diagnosis not present

## 2023-01-13 LAB — CBC WITH DIFFERENTIAL/PLATELET
Basophils Absolute: 0 10*3/uL (ref 0.0–0.1)
Basophils Relative: 0.7 % (ref 0.0–3.0)
Eosinophils Absolute: 0.2 10*3/uL (ref 0.0–0.7)
Eosinophils Relative: 3.5 % (ref 0.0–5.0)
HCT: 40.6 % (ref 36.0–46.0)
Hemoglobin: 13.3 g/dL (ref 12.0–15.0)
Lymphocytes Relative: 28.9 % (ref 12.0–46.0)
Lymphs Abs: 1.6 10*3/uL (ref 0.7–4.0)
MCHC: 32.7 g/dL (ref 30.0–36.0)
MCV: 85.7 fl (ref 78.0–100.0)
Monocytes Absolute: 0.4 10*3/uL (ref 0.1–1.0)
Monocytes Relative: 7.1 % (ref 3.0–12.0)
Neutro Abs: 3.2 10*3/uL (ref 1.4–7.7)
Neutrophils Relative %: 59.8 % (ref 43.0–77.0)
Platelets: 332 10*3/uL (ref 150.0–400.0)
RBC: 4.74 Mil/uL (ref 3.87–5.11)
RDW: 13.5 % (ref 11.5–15.5)
WBC: 5.4 10*3/uL (ref 4.0–10.5)

## 2023-01-13 LAB — MICROALBUMIN / CREATININE URINE RATIO
Creatinine,U: 63.6 mg/dL
Microalb Creat Ratio: 1.1 mg/g (ref 0.0–30.0)
Microalb, Ur: <0.7 mg/dL (ref 0.0–1.9)

## 2023-01-13 LAB — COMPREHENSIVE METABOLIC PANEL
ALT: 24 U/L (ref 0–35)
AST: 22 U/L (ref 0–37)
Albumin: 4.4 g/dL (ref 3.5–5.2)
Alkaline Phosphatase: 68 U/L (ref 39–117)
BUN: 14 mg/dL (ref 6–23)
CO2: 28 mEq/L (ref 19–32)
Calcium: 9.5 mg/dL (ref 8.4–10.5)
Chloride: 98 mEq/L (ref 96–112)
Creatinine, Ser: 0.83 mg/dL (ref 0.40–1.20)
GFR: 82.89 mL/min (ref >60.00)
Glucose, Bld: 180 mg/dL — ABNORMAL HIGH (ref 70–99)
Potassium: 4.1 mEq/L (ref 3.5–5.1)
Sodium: 136 mEq/L (ref 135–145)
Total Bilirubin: 0.5 mg/dL (ref 0.2–1.2)
Total Protein: 6.8 g/dL (ref 6.0–8.3)

## 2023-01-13 LAB — LDL CHOLESTEROL, DIRECT: Direct LDL: 143 mg/dL

## 2023-01-13 LAB — LIPID PANEL
Cholesterol: 205 mg/dL — ABNORMAL HIGH (ref 0–200)
HDL: 34.6 mg/dL — ABNORMAL LOW (ref >39.00)
NonHDL: 170.4
Total CHOL/HDL Ratio: 6
Triglycerides: 203 mg/dL — ABNORMAL HIGH (ref 0.0–149.0)
VLDL: 40.6 mg/dL — ABNORMAL HIGH (ref 0.0–40.0)

## 2023-01-13 LAB — HEMOGLOBIN A1C: Hgb A1c MFr Bld: 7.7 % — ABNORMAL HIGH (ref 4.6–6.5)

## 2023-01-13 NOTE — Progress Notes (Signed)
Subjective:    Patient ID: Shelly Lewis, female    DOB: May 21, 1976, 47 y.o.   MRN: [id number]  Chief Complaint  Patient presents with   Medication Check    6 month follow-up on medication Discuss endo appt. Fasting     HPI Patient is in today for 6 month f/up.   She has gone about 2 months without metformin due to awful GI issues. Exercise ebbs and flows, light weights, walking. Nutrition - good overall, but still has sweet-tooth. Tendency to overeat.   Past Medical History:  Diagnosis Date   Allergy    Anxiety    Asthma    Blood transfusion without reported diagnosis    Diabetes mellitus without complication (HCC)    Dizziness    Family history of premature coronary artery disease 08/22/2015   GERD (gastroesophageal reflux disease)    Headache    Hyperlipidemia 08/22/2015   IBS (irritable bowel syndrome)    PONV (postoperative nausea and vomiting)    Thyroid disease    Hypothyroidism    Past Surgical History:  Procedure Laterality Date   DILATATION & CURETTAGE/HYSTEROSCOPY WITH MYOSURE N/A 09/17/2021   Procedure: DILATATION & CURETTAGE/HYSTEROSCOPY WITH MYOSURE;  Surgeon: Olivia Mackie, MD;  Location: [facility];  Service: Gynecology;  Laterality: N/A;   ECTOPIC PREGNANCY SURGERY     ELBOW FRACTURE SURGERY Left 01/2021   MIDDLE EAR SURGERY     TONSILLECTOMY      Family History  Problem Relation Age of Onset   Hyperlipidemia Mother    Depression Mother    Diabetes Father    Hyperlipidemia Father    Cancer Maternal Grandmother    Heart disease Maternal Grandmother    Heart disease Paternal Grandmother    Hyperlipidemia Paternal Grandfather    Stroke Paternal Grandfather     Social History   Tobacco Use   Smoking status: Never   Smokeless tobacco: Never  Substance Use Topics   Alcohol use: Yes    Alcohol/week: 4.0 standard drinks of alcohol    Types: 4 Glasses of wine per week    Comment: 2 glasses wine per week   Drug use: No      Allergies  Allergen Reactions   Metformin And Related Diarrhea and Other (See Comments)    Constipation + diarrhea, awful GI symptoms   Mold Extract [Trichophyton] Other (See Comments)    congestion   Gluten Meal Diarrhea    Migraines, Reflux   Milk (Cow) Diarrhea    Review of Systems NEGATIVE UNLESS OTHERWISE INDICATED IN HPI      Objective:     BP 124/80   Pulse 71   Temp (!) 97.5 F (36.4 C) (Temporal)   Resp 16   Ht 5\' 2"  (1.575 m)   Wt 165 lb (74.8 kg)   SpO2 98%   BMI 30.18 kg/m   Wt Readings from Last 3 Encounters:  01/13/23 165 lb (74.8 kg)  12/23/22 165 lb 3.2 oz (74.9 kg)  10/28/22 167 lb 12.8 oz (76.1 kg)    BP Readings from Last 3 Encounters:  01/13/23 124/80  12/23/22 110/78  10/28/22 130/84     Physical Exam Vitals and nursing note reviewed.  Constitutional:      Appearance: Normal appearance.  Eyes:     Extraocular Movements: Extraocular movements intact.     Conjunctiva/sclera: Conjunctivae normal.     Pupils: Pupils are equal, round, and reactive to light.  Cardiovascular:     Rate and  Rhythm: Normal rate.  Pulmonary:     Effort: Pulmonary effort is normal.  Musculoskeletal:     Right lower leg: No edema.     Left lower leg: No edema.  Neurological:     General: No focal deficit present.     Mental Status: She is alert and oriented to person, place, and time.  Psychiatric:        Mood and Affect: Mood normal.        Assessment & Plan:  Type 2 diabetes mellitus with hyperglycemia, without long-term current use of insulin (HCC) Assessment & Plan: Lab Results  Component Value Date   HGBA1C 7.1 (H) 10/24/2022   HGBA1C 7.1 (H) 07/15/2022   HGBA1C 6.7 (H) 01/11/2022   Update labs today Off Metformin the last 2 months due to horrible GI side effects Still trying to work on lifestyle habits. Can consider GLP1 or sulfonylurea for additional help. Pt regularly sees her eye doctor.    Orders: -     Comprehensive metabolic  panel -     Lipid panel -     Hemoglobin A1c -     Microalbumin / creatinine urine ratio -     CBC with Differential/Platelet  Mixed hyperlipidemia Assessment & Plan: Atorvastatin 10 mg daily, doing well so far, no side effects. Recheck lipid panel today, adjust accordingly. Need LDL <70 due to diabetes status.   The 10-year ASCVD risk score (Arnett DK, et al., 2019) is: 2.9%   Values used to calculate the score:     Age: 47 years     Sex: Female     Is Non-Hispanic African American: No     Diabetic: Yes     Tobacco smoker: No     Systolic Blood Pressure: 124 mmHg     Is BP treated: No     HDL Cholesterol: 39.7 mg/dL     Total Cholesterol: 189 mg/dL   Orders: -     Lipid panel  Anxiety and depression Assessment & Plan: Stable on Lexapro 20 mg and Buspar 7.5 mg, follows with psych NP.   Orders: -     CBC with Differential/Platelet        Return in about 4 months (around 05/15/2023) for recheck/follow-up.    Thierno Hun M Jenean Escandon, PA-C

## 2023-01-13 NOTE — Assessment & Plan Note (Signed)
Lab Results  Component Value Date   HGBA1C 7.1 (H) 10/24/2022   HGBA1C 7.1 (H) 07/15/2022   HGBA1C 6.7 (H) 01/11/2022   Update labs today Off Metformin the last 2 months due to horrible GI side effects Still trying to work on lifestyle habits. Can consider GLP1 or sulfonylurea for additional help. Pt regularly sees her eye doctor.

## 2023-01-13 NOTE — Assessment & Plan Note (Signed)
Stable on Lexapro 20 mg and Buspar 7.5 mg, follows with psych NP.

## 2023-01-13 NOTE — Assessment & Plan Note (Signed)
Atorvastatin 10 mg daily, doing well so far, no side effects. Recheck lipid panel today, adjust accordingly. Need LDL <70 due to diabetes status.   The 10-year ASCVD risk score (Arnett DK, et al., 2019) is: 2.9%   Values used to calculate the score:     Age: 47 years     Sex: Female     Is Non-Hispanic African American: No     Diabetic: Yes     Tobacco smoker: No     Systolic Blood Pressure: 124 mmHg     Is BP treated: No     HDL Cholesterol: 39.7 mg/dL     Total Cholesterol: 189 mg/dL

## 2023-01-14 ENCOUNTER — Encounter: Payer: Self-pay | Admitting: Physician Assistant

## 2023-01-15 ENCOUNTER — Other Ambulatory Visit: Payer: Self-pay | Admitting: *Deleted

## 2023-01-15 MED ORDER — ATORVASTATIN CALCIUM 20 MG PO TABS: 20.0000 mg | ORAL_TABLET | Freq: Every day | ORAL | 3 refills | Status: DC

## 2023-01-15 NOTE — Telephone Encounter (Signed)
New Rx Lipitor 20mg  daily send to pharmacy  Patient notified  Refused DM medication per now

## 2023-01-20 DIAGNOSIS — N393 Stress incontinence (female) (male): Secondary | ICD-10-CM | POA: Diagnosis not present

## 2023-01-20 DIAGNOSIS — E785 Hyperlipidemia, unspecified: Secondary | ICD-10-CM | POA: Diagnosis not present

## 2023-01-20 DIAGNOSIS — F419 Anxiety disorder, unspecified: Secondary | ICD-10-CM | POA: Diagnosis not present

## 2023-01-20 DIAGNOSIS — E119 Type 2 diabetes mellitus without complications: Secondary | ICD-10-CM | POA: Diagnosis not present
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
## 2023-02-06 ENCOUNTER — Encounter: Payer: Self-pay | Admitting: Neurology

## 2023-02-06 ENCOUNTER — Ambulatory Visit: Payer: BC Managed Care – PPO | Admitting: Neurology

## 2023-02-06 VITALS — BP 114/75 | HR 88 | Ht 62.0 in | Wt 158.2 lb

## 2023-02-06 DIAGNOSIS — H539 Unspecified visual disturbance: Secondary | ICD-10-CM | POA: Diagnosis not present

## 2023-02-06 DIAGNOSIS — G43709 Chronic migraine without aura, not intractable, without status migrainosus: Secondary | ICD-10-CM

## 2023-02-06 MED ORDER — NURTEC 75 MG PO TBDP: 75.0000 mg | ORAL_TABLET | ORAL | 11 refills | Status: DC

## 2023-02-06 MED ORDER — SUMATRIPTAN SUCCINATE 50 MG PO TABS: 50.0000 mg | ORAL_TABLET | ORAL | 11 refills | Status: DC

## 2023-02-06 NOTE — Patient Instructions (Addendum)
Try Nurtec 75 mg every other for migraine prevention. Continue Imitrex as needed.

## 2023-02-12 DIAGNOSIS — F411 Generalized anxiety disorder: Secondary | ICD-10-CM | POA: Diagnosis not present

## 2023-02-12 DIAGNOSIS — F332 Major depressive disorder, recurrent severe without psychotic features: Secondary | ICD-10-CM | POA: Diagnosis not present

## 2023-02-18 ENCOUNTER — Other Ambulatory Visit: Payer: Self-pay

## 2023-02-20 ENCOUNTER — Other Ambulatory Visit (HOSPITAL_COMMUNITY): Payer: Self-pay

## 2023-02-20 ENCOUNTER — Telehealth: Payer: Self-pay

## 2023-02-20 NOTE — Telephone Encounter (Signed)
*  [facility]  Pharmacy Patient Advocate Encounter  Received notification from [facility] that Prior Authorization for Nurtec 75MG  dispersible tablets  has been APPROVED from 02/20/2023 to 02/19/2024. Ran test claim, Copay is $0.00. This test claim was processed through [facility]- copay amounts may vary at other pharmacies due to pharmacy/plan contracts, or as the patient moves through the different stages of their insurance plan.   PA #/Case ID/Reference #: [id number]

## 2023-03-03 DIAGNOSIS — H6122 Impacted cerumen, left ear: Secondary | ICD-10-CM | POA: Diagnosis not present

## 2023-03-03 DIAGNOSIS — H9202 Otalgia, left ear: Secondary | ICD-10-CM | POA: Diagnosis not present

## 2023-03-03 DIAGNOSIS — H906 Mixed conductive and sensorineural hearing loss, bilateral: Secondary | ICD-10-CM | POA: Diagnosis not present

## 2023-03-03 DIAGNOSIS — Z9622 Myringotomy tube(s) status: Secondary | ICD-10-CM | POA: Diagnosis not present

## 2023-04-03 DIAGNOSIS — H01136 Eczematous dermatitis of left eye, unspecified eyelid: Secondary | ICD-10-CM | POA: Diagnosis not present

## 2023-04-03 DIAGNOSIS — E119 Type 2 diabetes mellitus without complications: Secondary | ICD-10-CM | POA: Diagnosis not present

## 2023-04-03 LAB — HM DIABETES EYE EXAM
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
## 2023-04-04 IMAGING — DX DG FOREARM 2V*L*
1 series · 2 of 2 positions shown · non-contrast
Comparison: Elbow series today

CLINICAL DATA: Fall, left arm pain

EXAM:
LEFT FOREARM - 2 VIEW

[Series 1: forearmbone · 0.14mm/px · 2 of 2 slices shown]
[im 1/2]
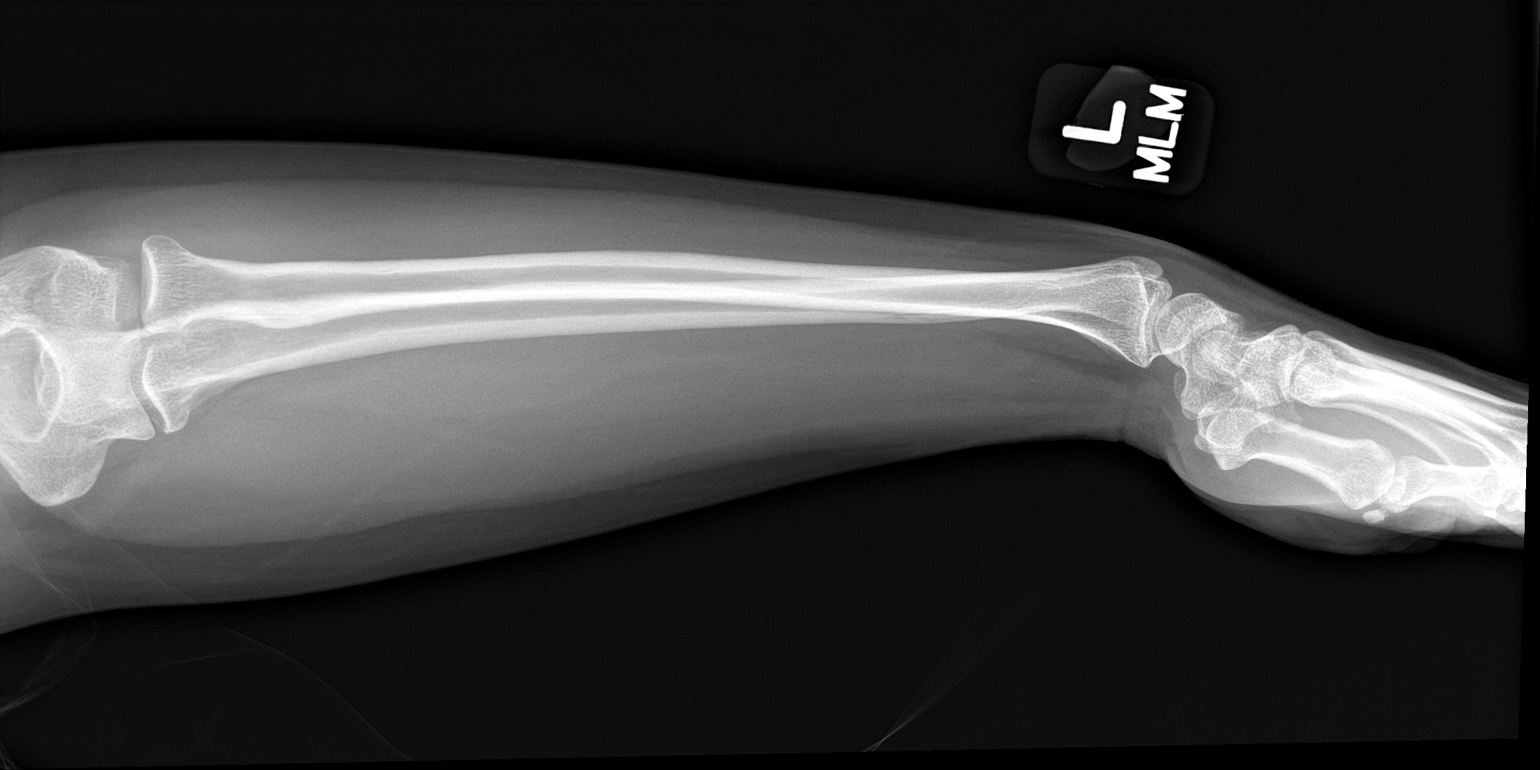
[im 2/2]
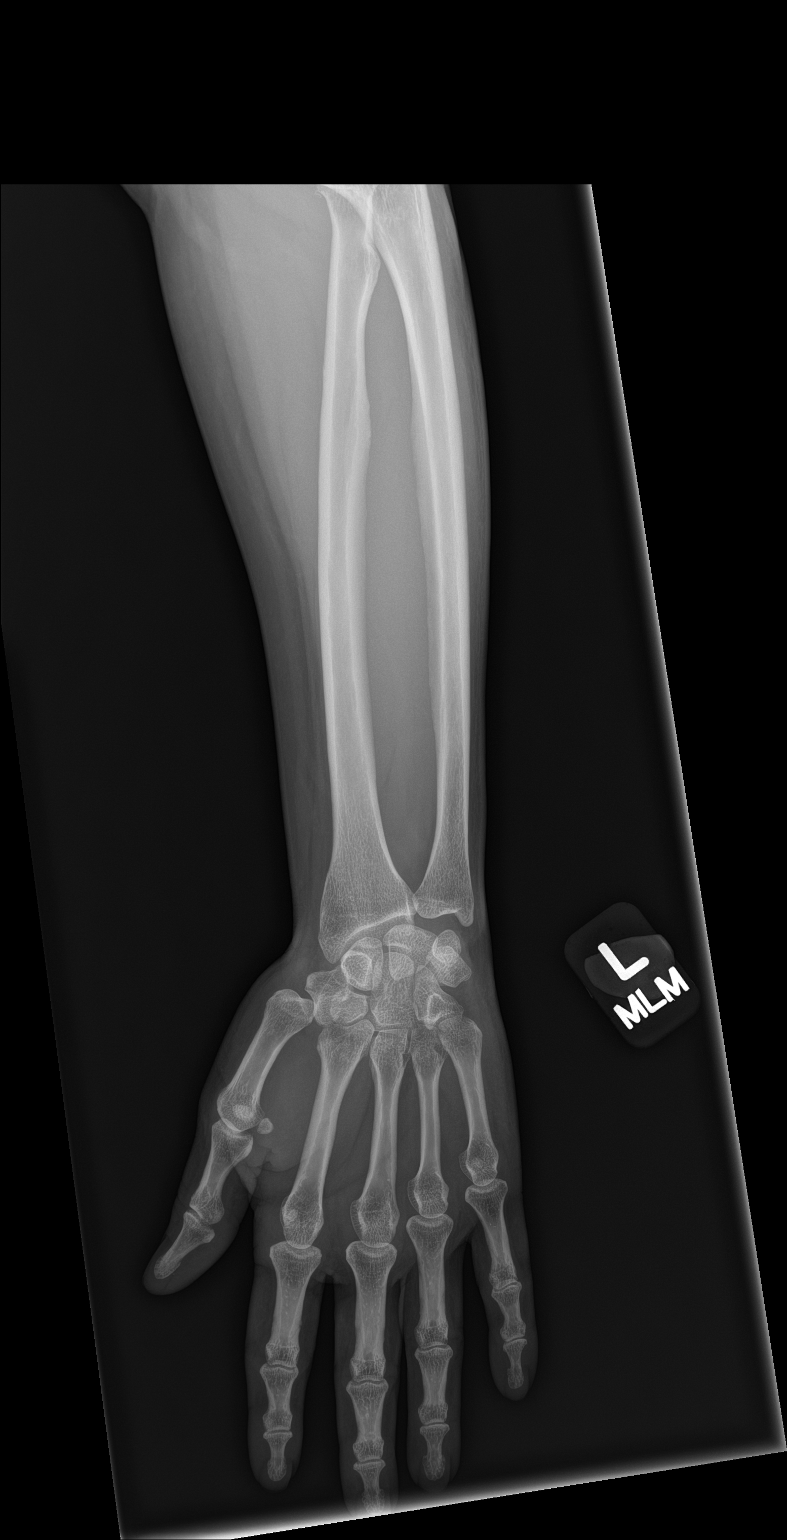

[2 of 2 positions shown; findings below may reference images not displayed]

FINDINGS: Limited views. Patient unable to move arm for optimal positioning.
There appears to be a fracture through the radial head/neck region.
No additional forearm abnormality.
IMPRESSION: Nondisplaced radial head/neck fracture.

## 2023-05-15 ENCOUNTER — Encounter: Payer: Self-pay | Admitting: Physician Assistant

## 2023-05-15 ENCOUNTER — Ambulatory Visit: Payer: BC Managed Care – PPO | Admitting: Physician Assistant

## 2023-05-15 VITALS — BP 126/84 | HR 78 | Temp 97.9°F | Ht 62.0 in | Wt 161.2 lb

## 2023-05-15 DIAGNOSIS — E1165 Type 2 diabetes mellitus with hyperglycemia: Secondary | ICD-10-CM

## 2023-05-15 DIAGNOSIS — E119 Type 2 diabetes mellitus without complications: Secondary | ICD-10-CM | POA: Insufficient documentation

## 2023-05-15 DIAGNOSIS — F332 Major depressive disorder, recurrent severe without psychotic features: Secondary | ICD-10-CM | POA: Diagnosis not present

## 2023-05-15 DIAGNOSIS — Z7984 Long term (current) use of oral hypoglycemic drugs: Secondary | ICD-10-CM | POA: Diagnosis not present

## 2023-05-15 DIAGNOSIS — E782 Mixed hyperlipidemia: Secondary | ICD-10-CM

## 2023-05-15 DIAGNOSIS — F411 Generalized anxiety disorder: Secondary | ICD-10-CM | POA: Diagnosis not present

## 2023-05-15 LAB — LIPID PANEL
Cholesterol: 197 mg/dL (ref 0–200)
HDL: 37.3 mg/dL — ABNORMAL LOW (ref >39.00)
LDL Cholesterol: 128 mg/dL — ABNORMAL HIGH (ref 0–99)
NonHDL: 159.66
Total CHOL/HDL Ratio: 5
Triglycerides: 159 mg/dL — ABNORMAL HIGH (ref 0.0–149.0)
VLDL: 31.8 mg/dL (ref 0.0–40.0)

## 2023-05-15 LAB — POCT GLYCOSYLATED HEMOGLOBIN (HGB A1C): Hemoglobin A1C: 7.2 % — AB (ref 4.0–5.6)

## 2023-05-15 MED ORDER — GLIPIZIDE ER 2.5 MG PO TB24: 2.5000 mg | ORAL_TABLET | Freq: Every day | ORAL | 2 refills | Status: DC

## 2023-05-15 NOTE — Patient Instructions (Signed)
Start on Glipizide XL 2.5 mg daily - ok to increase if still seeing elevated glucose numbers above 120. Keep me in the loop.  Cholesterol recheck today.  Keep working on good lifestyle.

## 2023-05-15 NOTE — Assessment & Plan Note (Signed)
Atorvastatin 20 mg daily, doing well so far, no side effects. Recheck lipid panel today, adjust accordingly. Need LDL <70 due to diabetes status.   The 10-year ASCVD risk score (Arnett DK, et al., 2019) is: 4%   Values used to calculate the score:     Age: 47 years     Sex: Female     Is Non-Hispanic African American: No     Diabetic: Yes     Tobacco smoker: No     Systolic Blood Pressure: 126 mmHg     Is BP treated: No     HDL Cholesterol: 34.6 mg/dL     Total Cholesterol: 205 mg/dL

## 2023-05-15 NOTE — Assessment & Plan Note (Addendum)
Lab Results  Component Value Date   HGBA1C 7.2 (A) 05/15/2023   HGBA1C 7.7 (H) 01/13/2023   HGBA1C 7.1 (H) 10/24/2022   Improving Cannot tolerate Metformin One semaglutide injection made her ill Will start Glipizide XL 2.5 mg daily Still trying to work on lifestyle habits. Pt regularly sees her eye doctor.

## 2023-05-15 NOTE — Progress Notes (Signed)
Patient ID: Shelly Lewis, female    DOB: [birth year], 47 y.o.   MRN: [id number]   Assessment & Plan:  Type 2 diabetes mellitus with hyperglycemia, without long-term current use of insulin [facility]) Assessment & Plan: Lab Results  Component Value Date   HGBA1C 7.2 (A) 05/15/2023   HGBA1C 7.7 (H) 01/13/2023   HGBA1C 7.1 (H) 10/24/2022   Improving Cannot tolerate Metformin One semaglutide injection made her ill Will start Glipizide XL 2.5 mg daily Still trying to work on lifestyle habits. Pt regularly sees her eye doctor.    Orders: -     POCT glycosylated hemoglobin (Hb A1C) -     glipiZIDE ER; Take 1 tablet (2.5 mg total) by mouth daily with breakfast.  Dispense: 30 tablet; Refill: 2  Mixed hyperlipidemia Assessment & Plan: Atorvastatin 20 mg daily, doing well so far, no side effects. Recheck lipid panel today, adjust accordingly. Need LDL <70 due to diabetes status.   The 10-year ASCVD risk score (Arnett DK, et al., 2019) is: 4%   Values used to calculate the score:     Age: 1 years     Sex: Female     Is Non-Hispanic African American: No     Diabetic: Yes     Tobacco smoker: No     Systolic Blood Pressure: 126 mmHg     Is BP treated: No     HDL Cholesterol: 34.6 mg/dL     Total Cholesterol: 205 mg/dL   Orders: -     Lipid panel        Return in about 4 months (around 09/13/2023) for recheck/follow-up.    Subjective:    Chief Complaint  Patient presents with   Diabetes    Follow up    Diabetes   Patient is in today for f/up on T2DM.  [facility] - had a Semaglutide 0.5 mg shot there - made her very sick. Vomited, had chills, very fatigued after, but then her BS was in 90s for about a month after the shot. Feels like her nutrition has been fair; walks a lot, no classes or gym membership. Did not tolerate Metformin. Not taking anything for diabetes at this time.   Past Medical History:  Diagnosis Date   Allergy    Anxiety     Asthma    Blood transfusion without reported diagnosis    Diabetes mellitus without complication (HCC)    Dizziness    Family history of premature coronary artery disease 08/22/2015   GERD (gastroesophageal reflux disease)    Headache    Hyperlipidemia 08/22/2015   IBS (irritable bowel syndrome)    PONV (postoperative nausea and vomiting)    Thyroid disease    Hypothyroidism    Past Surgical History:  Procedure Laterality Date   DILATATION & CURETTAGE/HYSTEROSCOPY WITH MYOSURE N/A 09/17/2021   Procedure: DILATATION & CURETTAGE/HYSTEROSCOPY WITH MYOSURE;  Surgeon: Olivia Mackie, MD;  Location: [facility];  Service: Gynecology;  Laterality: N/A;   ECTOPIC PREGNANCY SURGERY     ELBOW FRACTURE SURGERY Left 01/2021   MIDDLE EAR SURGERY     TONSILLECTOMY      Family History  Problem Relation Age of Onset   Hyperlipidemia Mother    Depression Mother    Diabetes Father    Hyperlipidemia Father    Cancer Maternal Grandmother    Heart disease Maternal Grandmother    Heart disease Paternal Grandmother    Hyperlipidemia Paternal Grandfather    Stroke  Paternal Grandfather     Social History   Tobacco Use   Smoking status: Never   Smokeless tobacco: Never  Substance Use Topics   Alcohol use: Yes    Alcohol/week: 4.0 standard drinks of alcohol    Types: 4 Glasses of wine per week    Comment: 2 glasses wine per week   Drug use: No     Allergies  Allergen Reactions   Metformin And Related Diarrhea and Other (See Comments)    Constipation + diarrhea, awful GI symptoms   Mold Extract [Trichophyton] Other (See Comments)    congestion   Gluten Meal Diarrhea    Migraines, Reflux   Milk (Cow) Diarrhea    Review of Systems NEGATIVE UNLESS OTHERWISE INDICATED IN HPI      Objective:     BP 126/84   Pulse 78   Temp 97.9 F (36.6 C) (Temporal)   Ht 5\' 2"  (1.575 m)   Wt 161 lb 3.2 oz (73.1 kg)   LMP 05/04/2023 (Exact Date)   SpO2 95%   BMI 29.48 kg/m    Wt Readings from Last 3 Encounters:  05/15/23 161 lb 3.2 oz (73.1 kg)  02/06/23 158 lb 3.2 oz (71.8 kg)  01/13/23 165 lb (74.8 kg)    BP Readings from Last 3 Encounters:  05/15/23 126/84  02/06/23 114/75  01/13/23 124/80     Physical Exam Vitals reviewed.  Constitutional:      Appearance: Normal appearance.  Cardiovascular:     Rate and Rhythm: Normal rate and regular rhythm.  Pulmonary:     Effort: Pulmonary effort is normal.     Breath sounds: Normal breath sounds.  Neurological:     General: No focal deficit present.     Mental Status: She is alert and oriented to person, place, and time.  Psychiatric:        Mood and Affect: Mood normal.        Behavior: Behavior normal.         Alora Gorey M Johnnie Moten, PA-C

## 2023-07-29 ENCOUNTER — Ambulatory Visit: Payer: BC Managed Care – PPO | Admitting: Physician Assistant

## 2023-07-29 VITALS — BP 126/86 | HR 81 | Temp 97.5°F | Ht 62.0 in | Wt 163.6 lb

## 2023-07-29 DIAGNOSIS — S90221A Contusion of right lesser toe(s) with damage to nail, initial encounter: Secondary | ICD-10-CM | POA: Diagnosis not present

## 2023-07-29 NOTE — Progress Notes (Signed)
 Patient ID: ARTEMISIA AUVIL, female    DOB: May 17, 1976, 48 y.o.   MRN: [id number]   Assessment & Plan:  Contusion of toenail of right foot, initial encounter    Assessment and Plan    Right Second Toe Discoloration Possible trauma or early nail fungus. No signs of malignancy. Nail thickened with a small crack. -Advise to let toenail grow out and clip as it grows. -If condition worsens or black discoloration appears on skin under nail, patient should return for further evaluation.   General Health Maintenance -All other health maintenance up to date. -No need for medication refills at this time.      F/up as scheduled  Subjective:    Chief Complaint  Patient presents with   Nail Problem    Pt in office regarding mark on her toe nail; Pt states it has been there for month.Right foot second toe.    HPI  History of Present Illness   KATHLEAN CINCO is a 48 year old female who presents with a thickened nail on the right second toe.  She has had a thickened nail on her right second toe for over a month. There is no recollection of specific trauma to the toe that could have caused this change. She occasionally paints her toenails, but not regularly. She is concerned about the possibility of a nail fungus or other issues such as cancer, although the rest of her toenails appear normal.  Her A1c was checked two months ago and was doing well. She does not require any medication refills at this time.       Past Medical History:  Diagnosis Date   Allergy    Anxiety    Asthma    Blood transfusion without reported diagnosis    Diabetes mellitus without complication (HCC)    Dizziness    Family history of premature coronary artery disease 08/22/2015   GERD (gastroesophageal reflux disease)    Headache    Hyperlipidemia 08/22/2015   IBS (irritable bowel syndrome)    PONV (postoperative nausea and vomiting)    Thyroid disease    Hypothyroidism    Past Surgical History:   Procedure Laterality Date   DILATATION & CURETTAGE/HYSTEROSCOPY WITH MYOSURE N/A 09/17/2021   Procedure: DILATATION & CURETTAGE/HYSTEROSCOPY WITH MYOSURE;  Surgeon: Olivia Mackie, MD;  Location: [facility];  Service: Gynecology;  Laterality: N/A;   ECTOPIC PREGNANCY SURGERY     ELBOW FRACTURE SURGERY Left 01/2021   MIDDLE EAR SURGERY     TONSILLECTOMY      Family History  Problem Relation Age of Onset   Hyperlipidemia Mother    Depression Mother    Diabetes Father    Hyperlipidemia Father    Cancer Maternal Grandmother    Heart disease Maternal Grandmother    Heart disease Paternal Grandmother    Hyperlipidemia Paternal Grandfather    Stroke Paternal Grandfather     Social History   Tobacco Use   Smoking status: Never   Smokeless tobacco: Never  Substance Use Topics   Alcohol use: Yes    Alcohol/week: 4.0 standard drinks of alcohol    Types: 4 Glasses of wine per week    Comment: 2 glasses wine per week   Drug use: No     Allergies  Allergen Reactions   Metformin And Related Diarrhea and Other (See Comments)    Constipation + diarrhea, awful GI symptoms   Mold Extract [Trichophyton] Other (See Comments)    congestion  Gluten Meal Diarrhea    Migraines, Reflux   Milk (Cow) Diarrhea    Review of Systems NEGATIVE UNLESS OTHERWISE INDICATED IN HPI      Objective:     BP 126/86 (BP Location: Left Arm, Patient Position: Sitting, Cuff Size: Normal)   Pulse 81   Temp (!) 97.5 F (36.4 C) (Temporal)   Ht 5\' 2"  (1.575 m)   Wt 163 lb 9.6 oz (74.2 kg)   LMP 06/25/2023 (Approximate)   SpO2 96%   BMI 29.92 kg/m   Wt Readings from Last 3 Encounters:  07/29/23 163 lb 9.6 oz (74.2 kg)  05/15/23 161 lb 3.2 oz (73.1 kg)  02/06/23 158 lb 3.2 oz (71.8 kg)    BP Readings from Last 3 Encounters:  07/29/23 126/86  05/15/23 126/84  02/06/23 114/75     Physical Exam Vitals and nursing note reviewed.  Constitutional:      Appearance: Normal  appearance.  Musculoskeletal:     Right lower leg: No edema.     Left lower leg: No edema.  Skin:    Findings: No rash.     Comments: Right second toenail distally is a small crack and raised / thickened nail noted with underlying discoloration ?prior trauma.   Neurological:     Mental Status: She is alert.     Comments: N/v intact bilateral feet  Psychiatric:        Mood and Affect: Mood normal.        Behavior: Behavior normal.         Zakaree Mcclenahan M Harjot Dibello, PA-C

## 2023-08-07 ENCOUNTER — Telehealth: Payer: Self-pay | Admitting: Neurology

## 2023-08-07 NOTE — Telephone Encounter (Signed)
 Pt cancelled appointment due to work schedule conflict. Will call back to reschedule

## 2023-08-13 ENCOUNTER — Telehealth: Payer: BC Managed Care – PPO | Admitting: Neurology

## 2023-08-14 DIAGNOSIS — F332 Major depressive disorder, recurrent severe without psychotic features: Secondary | ICD-10-CM | POA: Diagnosis not present

## 2023-08-14 DIAGNOSIS — F411 Generalized anxiety disorder: Secondary | ICD-10-CM | POA: Diagnosis not present

## 2023-08-24 ENCOUNTER — Other Ambulatory Visit: Payer: Self-pay | Admitting: Physician Assistant

## 2023-08-24 DIAGNOSIS — E1165 Type 2 diabetes mellitus with hyperglycemia: Secondary | ICD-10-CM

## 2023-09-16 ENCOUNTER — Encounter: Payer: Self-pay | Admitting: Physician Assistant

## 2023-09-16 ENCOUNTER — Ambulatory Visit: Payer: BC Managed Care – PPO | Admitting: Physician Assistant

## 2023-09-16 VITALS — BP 118/82 | HR 76 | Temp 97.2°F | Ht 62.0 in | Wt 162.8 lb

## 2023-09-16 DIAGNOSIS — E1165 Type 2 diabetes mellitus with hyperglycemia: Secondary | ICD-10-CM | POA: Diagnosis not present

## 2023-09-16 DIAGNOSIS — Z7984 Long term (current) use of oral hypoglycemic drugs: Secondary | ICD-10-CM | POA: Diagnosis not present

## 2023-09-16 DIAGNOSIS — Z1231 Encounter for screening mammogram for malignant neoplasm of breast: Secondary | ICD-10-CM | POA: Diagnosis not present

## 2023-09-16 DIAGNOSIS — E782 Mixed hyperlipidemia: Secondary | ICD-10-CM | POA: Diagnosis not present

## 2023-09-16 DIAGNOSIS — Z23 Encounter for immunization: Secondary | ICD-10-CM

## 2023-09-16 DIAGNOSIS — Z01419 Encounter for gynecological examination (general) (routine) without abnormal findings: Secondary | ICD-10-CM | POA: Diagnosis not present

## 2023-09-16 DIAGNOSIS — F40243 Fear of flying: Secondary | ICD-10-CM | POA: Diagnosis not present

## 2023-09-16 DIAGNOSIS — Z124 Encounter for screening for malignant neoplasm of cervix: Secondary | ICD-10-CM | POA: Diagnosis not present

## 2023-09-16 DIAGNOSIS — Z1331 Encounter for screening for depression: Secondary | ICD-10-CM | POA: Diagnosis not present

## 2023-09-16 LAB — POCT GLYCOSYLATED HEMOGLOBIN (HGB A1C): Hemoglobin A1C: 6 % — AB (ref 4.0–5.6)

## 2023-09-16 MED ORDER — PRAVASTATIN SODIUM 20 MG PO TABS: 20.0000 mg | ORAL_TABLET | Freq: Every day | ORAL | 3 refills | Status: AC

## 2023-09-16 MED ORDER — ALPRAZOLAM 0.5 MG PO TABS: 0.5000 mg | ORAL_TABLET | Freq: Two times a day (BID) | ORAL | 0 refills | Status: DC | PRN

## 2023-09-16 MED ORDER — DICYCLOMINE HCL 10 MG PO CAPS: 10.0000 mg | ORAL_CAPSULE | Freq: Three times a day (TID) | ORAL | 0 refills | Status: DC

## 2023-09-16 NOTE — Progress Notes (Signed)
 Patient ID: Shelly Lewis, female    DOB: [birth year], 48 y.o.   MRN: [id number]   Assessment & Plan:  Type 2 diabetes mellitus with hyperglycemia, without long-term current use of insulin (HCC) -     POCT glycosylated hemoglobin (Hb A1C)  Mixed hyperlipidemia -     Pravastatin Sodium; Take 1 tablet (20 mg total) by mouth daily.  Dispense: 90 tablet; Refill: 3  Need for pneumococcal vaccine -     Pneumococcal conjugate vaccine 20-valent  Fear of flying -     Dicyclomine HCl; Take 1 capsule (10 mg total) by mouth 4 (four) times daily -  before meals and at bedtime for 5 days. Take for diarrhea and cramping.  Dispense: 20 capsule; Refill: 0 -     ALPRAZolam; Take 1 tablet (0.5 mg total) by mouth 2 (two) times daily as needed for anxiety.  Dispense: 6 tablet; Refill: 0      Assessment and Plan Assessment & Plan Type 2 Diabetes Mellitus Type 2 diabetes mellitus with significant improvement in glycemic control. Hemoglobin A1c has decreased from 7.7% eight months ago and 7.2% four months ago to 6.0% currently. She was previously unable to tolerate metformin and semaglutide due to side effects. Glipizide XL 2.5 mg has been effective in managing her blood glucose levels without side effects. - Continue glipizide XL 2.5 mg daily - Recheck hemoglobin A1c in six months  Hyperlipidemia Hyperlipidemia with intolerance to atorvastatin due to muscle aches and pains. She has been taking atorvastatin 20 mg every other night but continues to experience side effects. Pravastatin is considered as an alternative due to its potentially better tolerability. - Discontinue atorvastatin - Start pravastatin 20 mg every other day - Monitor for improvement in muscle aches and lipid levels  Travel-related Anxiety and IBS-like Symptoms She experiences diarrhea and stomach issues associated with flying, likely due to anxiety and change in routine. Symptoms are suggestive of irritable bowel syndrome (IBS)  triggered by travel-related stress. Dicyclomine, an antispasmodic, is recommended to relax stomach muscles. Xanax is also prescribed for acute anxiety during flights. - Prescribe dicyclomine for use a few days before and on the day of travel - Prescribe a short course of Xanax (six tablets) for use during flights as needed  General Health Maintenance She received the Prevnar 20 vaccine during this visit.  Follow-up She is scheduled for a follow-up visit in six months to reassess her diabetes management and other health concerns. A urine microalbumin test is planned for later this year. - Schedule follow-up appointment in six months - Plan to repeat urine microalbumin test later this year      Return in about 6 months (around 03/17/2024) for recheck/follow-up.    Subjective:    Chief Complaint  Patient presents with   Diabetes    Pt in office for diabetes follow up; no concerns with her diabetes to discuss; has upcoming trip in May and wants to discuss possible recommendations for GI issues when flying;     Diabetes   Discussed the use of AI scribe software for clinical note transcription with the patient, who gave verbal consent to proceed.  History of Present Illness Shelly Lewis is a 48 year old female with type 2 diabetes who presents for follow-up.  She has type 2 diabetes with a significant improvement in hemoglobin A1c from 7.7% eight months ago and 7.2% four months ago to 6.0% today. She is currently taking glipizide XL 2.5 mg daily, initiated four  months ago, and it has been effective without side effects. She previously could not tolerate metformin and experienced adverse effects with semaglutide.  She has hyperlipidemia and is taking atorvastatin 20 mg every other night. She experiences muscle aches and pains with this medication.  She experiences anxiety related to travel, particularly flying, which manifests as diarrhea. This occurs primarily on the day of  travel. She has not traveled internationally since 2000 and is planning a trip to [place] in May for her doctoral program. She is concerned about managing her symptoms during this trip.     Past Medical History:  Diagnosis Date   Allergy    Anxiety    Asthma    Blood transfusion without reported diagnosis    Diabetes mellitus without complication (HCC)    Dizziness    Family history of premature coronary artery disease 08/22/2015   GERD (gastroesophageal reflux disease)    Headache    Hyperlipidemia 08/22/2015   IBS (irritable bowel syndrome)    PONV (postoperative nausea and vomiting)    Thyroid disease    Hypothyroidism    Past Surgical History:  Procedure Laterality Date   DILATATION & CURETTAGE/HYSTEROSCOPY WITH MYOSURE N/A 09/17/2021   Procedure: DILATATION & CURETTAGE/HYSTEROSCOPY WITH MYOSURE;  Surgeon: Olivia Mackie, MD;  Location: [facility];  Service: Gynecology;  Laterality: N/A;   ECTOPIC PREGNANCY SURGERY     ELBOW FRACTURE SURGERY Left 01/2021   MIDDLE EAR SURGERY     TONSILLECTOMY      Family History  Problem Relation Age of Onset   Hyperlipidemia Mother    Depression Mother    Diabetes Father    Hyperlipidemia Father    Cancer Maternal Grandmother    Heart disease Maternal Grandmother    Heart disease Paternal Grandmother    Hyperlipidemia Paternal Grandfather    Stroke Paternal Grandfather     Social History   Tobacco Use   Smoking status: Never   Smokeless tobacco: Never  Substance Use Topics   Alcohol use: Yes    Alcohol/week: 4.0 standard drinks of alcohol    Types: 4 Glasses of wine per week    Comment: 2 glasses wine per week   Drug use: No     Allergies  Allergen Reactions   Metformin And Related Diarrhea and Other (See Comments)    Constipation + diarrhea, awful GI symptoms   Mold Extract [Trichophyton] Other (See Comments)    congestion   Gluten Meal Diarrhea    Migraines, Reflux   Milk (Cow) Diarrhea     Review of Systems NEGATIVE UNLESS OTHERWISE INDICATED IN HPI      Objective:     BP 118/82 (BP Location: Left Arm, Patient Position: Sitting, Cuff Size: Normal)   Pulse 76   Temp (!) 97.2 F (36.2 C) (Temporal)   Ht 5\' 2"  (1.575 m)   Wt 162 lb 12.8 oz (73.8 kg)   LMP 09/01/2023 (Exact Date)   SpO2 96%   BMI 29.78 kg/m   Wt Readings from Last 3 Encounters:  09/16/23 162 lb 12.8 oz (73.8 kg)  07/29/23 163 lb 9.6 oz (74.2 kg)  05/15/23 161 lb 3.2 oz (73.1 kg)    BP Readings from Last 3 Encounters:  09/16/23 118/82  07/29/23 126/86  05/15/23 126/84     Physical Exam Vitals and nursing note reviewed.  Constitutional:      Appearance: Normal appearance.  Eyes:     Extraocular Movements: Extraocular movements intact.     Conjunctiva/sclera:  Conjunctivae normal.     Pupils: Pupils are equal, round, and reactive to light.  Cardiovascular:     Rate and Rhythm: Normal rate and regular rhythm.  Pulmonary:     Effort: Pulmonary effort is normal.     Breath sounds: Normal breath sounds.  Neurological:     General: No focal deficit present.     Mental Status: She is alert and oriented to person, place, and time.  Psychiatric:        Mood and Affect: Mood normal.        Behavior: Behavior normal.       Rozalynn Buege M Rhaelyn Giron, PA-C

## 2023-11-04 DIAGNOSIS — Z1211 Encounter for screening for malignant neoplasm of colon: Secondary | ICD-10-CM | POA: Diagnosis not present

## 2023-11-04 DIAGNOSIS — Z1212 Encounter for screening for malignant neoplasm of rectum: Secondary | ICD-10-CM | POA: Diagnosis not present

## 2023-11-10 DIAGNOSIS — L821 Other seborrheic keratosis: Secondary | ICD-10-CM | POA: Diagnosis not present

## 2023-11-10 DIAGNOSIS — D229 Melanocytic nevi, unspecified: Secondary | ICD-10-CM | POA: Diagnosis not present

## 2023-11-10 DIAGNOSIS — L814 Other melanin hyperpigmentation: Secondary | ICD-10-CM | POA: Diagnosis not present

## 2023-11-10 DIAGNOSIS — L578 Other skin changes due to chronic exposure to nonionizing radiation: Secondary | ICD-10-CM | POA: Diagnosis not present

## 2023-11-10 LAB — COLOGUARD: COLOGUARD: NEGATIVE

## 2023-11-14 DIAGNOSIS — F411 Generalized anxiety disorder: Secondary | ICD-10-CM | POA: Diagnosis not present

## 2023-11-14 DIAGNOSIS — F332 Major depressive disorder, recurrent severe without psychotic features: Secondary | ICD-10-CM | POA: Diagnosis not present

## 2023-12-02 DIAGNOSIS — H9311 Tinnitus, right ear: Secondary | ICD-10-CM | POA: Diagnosis not present

## 2023-12-02 DIAGNOSIS — H90A12 Conductive hearing loss, unilateral, left ear with restricted hearing on the contralateral side: Secondary | ICD-10-CM | POA: Diagnosis not present

## 2023-12-02 DIAGNOSIS — H90A21 Sensorineural hearing loss, unilateral, right ear, with restricted hearing on the contralateral side: Secondary | ICD-10-CM | POA: Diagnosis not present

## 2024-02-03 DIAGNOSIS — F411 Generalized anxiety disorder: Secondary | ICD-10-CM | POA: Diagnosis not present

## 2024-02-15 ENCOUNTER — Other Ambulatory Visit: Payer: Self-pay | Admitting: Physician Assistant

## 2024-02-15 DIAGNOSIS — E1165 Type 2 diabetes mellitus with hyperglycemia: Secondary | ICD-10-CM

## 2024-02-16 DIAGNOSIS — M25579 Pain in unspecified ankle and joints of unspecified foot: Secondary | ICD-10-CM | POA: Insufficient documentation

## 2024-02-16 DIAGNOSIS — M79671 Pain in right foot: Secondary | ICD-10-CM | POA: Diagnosis not present

## 2024-02-16 DIAGNOSIS — M25571 Pain in right ankle and joints of right foot: Secondary | ICD-10-CM | POA: Diagnosis not present

## 2024-02-17 DIAGNOSIS — F411 Generalized anxiety disorder: Secondary | ICD-10-CM | POA: Diagnosis not present

## 2024-02-24 DIAGNOSIS — F411 Generalized anxiety disorder: Secondary | ICD-10-CM | POA: Diagnosis not present

## 2024-02-27 DIAGNOSIS — S82831A Other fracture of upper and lower end of right fibula, initial encounter for closed fracture: Secondary | ICD-10-CM | POA: Insufficient documentation

## 2024-03-01 DIAGNOSIS — S82831A Other fracture of upper and lower end of right fibula, initial encounter for closed fracture: Secondary | ICD-10-CM | POA: Diagnosis not present

## 2024-03-12 DIAGNOSIS — F411 Generalized anxiety disorder: Secondary | ICD-10-CM | POA: Diagnosis not present

## 2024-03-16 DIAGNOSIS — F411 Generalized anxiety disorder: Secondary | ICD-10-CM | POA: Diagnosis not present

## 2024-03-17 ENCOUNTER — Ambulatory Visit: Admitting: Physician Assistant

## 2024-03-17 ENCOUNTER — Encounter: Payer: Self-pay | Admitting: Physician Assistant

## 2024-03-17 ENCOUNTER — Ambulatory Visit: Payer: Self-pay | Admitting: Physician Assistant

## 2024-03-17 VITALS — BP 116/80 | Temp 97.7°F | Ht 62.0 in | Wt 162.2 lb

## 2024-03-17 DIAGNOSIS — Z23 Encounter for immunization: Secondary | ICD-10-CM

## 2024-03-17 DIAGNOSIS — S82821S Torus fracture of lower end of right fibula, sequela: Secondary | ICD-10-CM | POA: Diagnosis not present

## 2024-03-17 DIAGNOSIS — E1165 Type 2 diabetes mellitus with hyperglycemia: Secondary | ICD-10-CM

## 2024-03-17 DIAGNOSIS — E782 Mixed hyperlipidemia: Secondary | ICD-10-CM

## 2024-03-17 DIAGNOSIS — H9193 Unspecified hearing loss, bilateral: Secondary | ICD-10-CM | POA: Diagnosis not present

## 2024-03-17 DIAGNOSIS — Z7984 Long term (current) use of oral hypoglycemic drugs: Secondary | ICD-10-CM

## 2024-03-17 LAB — MICROALBUMIN / CREATININE URINE RATIO
Creatinine,U: 110.8 mg/dL
Microalb Creat Ratio: 7.3 mg/g (ref 0.0–30.0)
Microalb, Ur: 0.8 mg/dL (ref 0.0–1.9)

## 2024-03-17 LAB — VITAMIN D 25 HYDROXY (VIT D DEFICIENCY, FRACTURES): VITD: 28.82 ng/mL — ABNORMAL LOW (ref 30.00–100.00)

## 2024-03-17 LAB — LIPID PANEL
Cholesterol: 240 mg/dL — ABNORMAL HIGH (ref 0–200)
HDL: 41.3 mg/dL (ref >39.00)
LDL Cholesterol: 169 mg/dL — ABNORMAL HIGH (ref 0–99)
NonHDL: 199.12
Total CHOL/HDL Ratio: 6
Triglycerides: 150 mg/dL — ABNORMAL HIGH (ref 0.0–149.0)
VLDL: 30 mg/dL (ref 0.0–40.0)

## 2024-03-17 LAB — COMPREHENSIVE METABOLIC PANEL WITH GFR
ALT: 22 U/L (ref 0–35)
AST: 22 U/L (ref 0–37)
Albumin: 4.7 g/dL (ref 3.5–5.2)
Alkaline Phosphatase: 61 U/L (ref 39–117)
BUN: 17 mg/dL (ref 6–23)
CO2: 26 meq/L (ref 19–32)
Calcium: 9.1 mg/dL (ref 8.4–10.5)
Chloride: 100 meq/L (ref 96–112)
Creatinine, Ser: 0.82 mg/dL (ref 0.40–1.20)
GFR: 83.41 mL/min (ref >60.00)
Glucose, Bld: 149 mg/dL — ABNORMAL HIGH (ref 70–99)
Potassium: 4.2 meq/L (ref 3.5–5.1)
Sodium: 137 meq/L (ref 135–145)
Total Bilirubin: 0.5 mg/dL (ref 0.2–1.2)
Total Protein: 6.9 g/dL (ref 6.0–8.3)

## 2024-03-17 LAB — HEMOGLOBIN A1C: Hgb A1c MFr Bld: 6.9 % — ABNORMAL HIGH (ref 4.6–6.5)

## 2024-03-17 NOTE — Progress Notes (Signed)
 Patient ID: Shelly Lewis, female    DOB: 1975-08-04, 48 y.o.   MRN: [id number]   Assessment & Plan:  Type 2 diabetes mellitus with hyperglycemia, without long-term current use of insulin (HCC) -     Comprehensive metabolic panel with GFR -     Hemoglobin A1c -     Microalbumin / creatinine urine ratio -     Lipid panel  Mixed hyperlipidemia  Closed torus fracture of distal end of right fibula, sequela -     VITAMIN D 25 Hydroxy (Vit-D Deficiency, Fractures)  Immunization due -     Flu vaccine trivalent PF, 6mos and older(Flulaval,Afluria,Fluarix,Fluzone)    Assessment & Plan Type 2 diabetes mellitus Type 2 diabetes mellitus with significant improvement in A1c over the past year. Currently managed with glipizide  XL 2.5 mg daily. Dietary management is going well. - Continue glipizide  XL 2.5 mg daily - Monitor A1c with lab tests Lab Results  Component Value Date   HGBA1C 6.0 (A) 09/16/2023   HGBA1C 7.2 (A) 05/15/2023   HGBA1C 7.7 (H) 01/13/2023    Hyperlipidemia Hyperlipidemia previously managed with atorvastatin , which caused side effects. Switched to pravastatin  20 mg daily. Currently taking it three times a week due to stomach issues but plans to increase frequency. Discussed potential benefits of CoQ10 and vitamin D supplementation to mitigate statin-associated myalgia. - Continue pravastatin  20 mg daily - Increase pravastatin  frequency as tolerated - Consider CoQ10 supplementation - Check vitamin D levels - Monitor lipid levels with lab tests  Ankle fracture Hairline fracture of the ankle sustained during a hiking accident. Currently out of the boot and in a brace. Follow-up care with [facility]. - Follow up with [facility] on Tuesday - Consider calcium  supplementation - Check calcium  levels - Check vitamin D levels  Hearing loss Hearing loss managed with hearing aids for the past year. No family history of hearing issues, but she was born with hearing  problems and had surgery at age [age]. - Continue using hearing aids      Return in about 6 months (around 09/15/2024) for recheck/follow-up.    Subjective:    Chief Complaint  Patient presents with   Diabetes    Pt in office for 6 mon diabetes follow up and yearly labs;     Diabetes   Discussed the use of AI scribe software for clinical note transcription with the patient, who gave verbal consent to proceed.  History of Present Illness Shelly Lewis is a 48 year old female with type two diabetes who presents for routine follow-up and lab work.  She has type two diabetes and has seen significant improvement in her A1c over the past year. She is currently taking glipizide  XL 2.5 mg daily.  She has hyperlipidemia and was previously on atorvastatin  but experienced issues with it. She was switched to pravastatin  20 mg daily and reports better tolerance, although she is currently taking it three times a week due to previous stomach problems.  Recently, she sustained a hairline fracture in her ankle while walking on a trail during Labor Day weekend. She is currently out of the boot and wearing a brace. She is being followed by [facility] and has a follow-up appointment scheduled. The injury has impacted her ability to exercise, which she finds frustrating.  She has a history of hearing issues and has been using hearing aids for about a year. She was born with hearing problems and had her first ear surgery at  the age of [age]. There is no family history of hearing issues.  She received her flu shot today and is having labs drawn to check her lipids and A1c. She also mentioned a past prescription of Bentyl  for stomach issues during a trip to [place], which she found helpful.  She has a history of a leg that turned in at birth, which corrected itself mostly, but she wonders if it contributes to her vulnerability to injuries. She has had a previous fracture in her elbow from a fall at  work, which required surgery.  She does not consume milk due to intolerance and has reordered calcium  supplements following her recent ankle fracture. She has not taken vitamin D supplements recently but had normal levels a few years ago.     Past Medical History:  Diagnosis Date   Allergy    Anxiety    Asthma    Blood transfusion without reported diagnosis    Diabetes mellitus without complication (HCC)    Dizziness    Family history of premature coronary artery disease 08/22/2015   GERD (gastroesophageal reflux disease)    Headache    Hyperlipidemia 08/22/2015   IBS (irritable bowel syndrome)    PONV (postoperative nausea and vomiting)    Thyroid  disease    Hypothyroidism    Past Surgical History:  Procedure Laterality Date   DILATATION & CURETTAGE/HYSTEROSCOPY WITH MYOSURE N/A 09/17/2021   Procedure: DILATATION & CURETTAGE/HYSTEROSCOPY WITH MYOSURE;  Surgeon: Shelly Ade, MD;  Location: [facility];  Service: Gynecology;  Laterality: N/A;   ECTOPIC PREGNANCY SURGERY     ELBOW FRACTURE SURGERY Left 01/2021   MIDDLE EAR SURGERY     TONSILLECTOMY      Family History  Problem Relation Age of Onset   Hyperlipidemia Mother    Depression Mother    Diabetes Father    Hyperlipidemia Father    Cancer Maternal Grandmother    Heart disease Maternal Grandmother    Heart disease Paternal Grandmother    Hyperlipidemia Paternal Grandfather    Stroke Paternal Grandfather     Social History   Tobacco Use   Smoking status: Never   Smokeless tobacco: Never  Substance Use Topics   Alcohol use: Yes    Alcohol/week: 4.0 standard drinks of alcohol    Types: 4 Glasses of wine per week    Comment: 2 glasses wine per week   Drug use: No     Allergies  Allergen Reactions   Metformin  And Related Diarrhea and Other (See Comments)    Constipation + diarrhea, awful GI symptoms   Mold Extract [Trichophyton] Other (See Comments)    congestion   Gluten Meal  Diarrhea    Migraines, Reflux   Milk (Cow) Diarrhea    Review of Systems NEGATIVE UNLESS OTHERWISE INDICATED IN HPI      Objective:     BP 116/80 (BP Location: Left Arm, Patient Position: Sitting, Cuff Size: Normal)   Temp 97.7 F (36.5 C) (Temporal)   Ht 5' 2 (1.575 m)   Wt 162 lb 3.2 oz (73.6 kg)   LMP 03/10/2024 (Approximate)   BMI 29.67 kg/m   Wt Readings from Last 3 Encounters:  03/17/24 162 lb 3.2 oz (73.6 kg)  09/16/23 162 lb 12.8 oz (73.8 kg)  07/29/23 163 lb 9.6 oz (74.2 kg)    BP Readings from Last 3 Encounters:  03/17/24 116/80  09/16/23 118/82  07/29/23 126/86     Physical Exam Vitals and nursing note reviewed.  Constitutional:      General: She is not in acute distress.    Appearance: Normal appearance. She is not ill-appearing.  HENT:     Head: Normocephalic.     Right Ear: External ear normal.     Left Ear: External ear normal.  Eyes:     Extraocular Movements: Extraocular movements intact.     Conjunctiva/sclera: Conjunctivae normal.     Pupils: Pupils are equal, round, and reactive to light.  Cardiovascular:     Rate and Rhythm: Normal rate and regular rhythm.     Pulses: Normal pulses.     Heart sounds: Normal heart sounds.  Pulmonary:     Effort: Pulmonary effort is normal.  Musculoskeletal:        General: Signs of injury (R ankle in a brace) present.     Cervical back: Normal range of motion.  Skin:    General: Skin is warm.  Neurological:     Mental Status: She is alert and oriented to person, place, and time.  Psychiatric:        Mood and Affect: Mood normal.        Behavior: Behavior normal.             Kwasi Joung M Jacobo Moncrief, PA-C

## 2024-03-17 NOTE — Patient Instructions (Signed)
  VISIT SUMMARY: Today, you had a follow-up visit to review your type 2 diabetes, hyperlipidemia, recent ankle fracture, and hearing loss. You received a flu shot and had lab work done to check your lipids and A1c levels.  YOUR PLAN: TYPE 2 DIABETES MELLITUS: Your type 2 diabetes has shown significant improvement in your A1c levels over the past year. You are currently managing it with glipizide  XL 2.5 mg daily and dietary changes. -Continue taking glipizide  XL 2.5 mg daily. -Monitor your A1c levels with lab tests.  HYPERLIPIDEMIA: You have hyperlipidemia and are currently taking pravastatin  20 mg daily, three times a week, due to previous stomach issues. You have tolerated this medication better than atorvastatin . -Continue taking pravastatin  20 mg daily. -Increase the frequency of pravastatin  as tolerated. -Consider taking CoQ10 supplements. -Check your vitamin D levels. -Monitor your lipid levels with lab tests.  ANKLE FRACTURE: You sustained a hairline fracture in your ankle during a hiking accident. You are currently out of the boot and wearing a brace, with follow-up care scheduled with [facility]. -Follow up with [facility] on Tuesday. -Consider taking calcium  supplements. -Check your calcium  levels. -Check your vitamin D levels.  HEARING LOSS: You have a history of hearing loss and have been using hearing aids for the past year. There is no family history of hearing issues. -Continue using your hearing aids.                      Contains text generated by Abridge.                                 Contains text generated by Abridge.

## 2024-03-22 DIAGNOSIS — S82831D Other fracture of upper and lower end of right fibula, subsequent encounter for closed fracture with routine healing: Secondary | ICD-10-CM | POA: Diagnosis not present

## 2024-04-08 DIAGNOSIS — E119 Type 2 diabetes mellitus without complications: Secondary | ICD-10-CM | POA: Diagnosis not present

## 2024-04-08 DIAGNOSIS — H01136 Eczematous dermatitis of left eye, unspecified eyelid: Secondary | ICD-10-CM | POA: Diagnosis not present

## 2024-04-08 LAB — OPHTHALMOLOGY REPORT-SCANNED

## 2024-04-13 DIAGNOSIS — F411 Generalized anxiety disorder: Secondary | ICD-10-CM | POA: Diagnosis not present

## 2024-04-27 DIAGNOSIS — F411 Generalized anxiety disorder: Secondary | ICD-10-CM | POA: Diagnosis not present

## 2024-05-11 DIAGNOSIS — F411 Generalized anxiety disorder: Secondary | ICD-10-CM | POA: Diagnosis not present

## 2024-05-18 DIAGNOSIS — F332 Major depressive disorder, recurrent severe without psychotic features: Secondary | ICD-10-CM | POA: Diagnosis not present

## 2024-05-18 DIAGNOSIS — F411 Generalized anxiety disorder: Secondary | ICD-10-CM | POA: Diagnosis not present

## 2024-05-25 DIAGNOSIS — F411 Generalized anxiety disorder: Secondary | ICD-10-CM | POA: Diagnosis not present

## 2024-07-08 ENCOUNTER — Encounter (HOSPITAL_BASED_OUTPATIENT_CLINIC_OR_DEPARTMENT_OTHER): Payer: Self-pay | Admitting: Emergency Medicine

## 2024-07-08 ENCOUNTER — Emergency Department (HOSPITAL_BASED_OUTPATIENT_CLINIC_OR_DEPARTMENT_OTHER)

## 2024-07-08 ENCOUNTER — Other Ambulatory Visit: Payer: Self-pay

## 2024-07-08 ENCOUNTER — Ambulatory Visit: Payer: Self-pay

## 2024-07-08 ENCOUNTER — Emergency Department (HOSPITAL_BASED_OUTPATIENT_CLINIC_OR_DEPARTMENT_OTHER): Admission: EM | Admit: 2024-07-08 | Discharge: 2024-07-08 | Disposition: A | Discharge status: Home / Self Care

## 2024-07-08 DIAGNOSIS — K529 Noninfective gastroenteritis and colitis, unspecified: Secondary | ICD-10-CM | POA: Diagnosis not present

## 2024-07-08 DIAGNOSIS — Z79899 Other long term (current) drug therapy: Secondary | ICD-10-CM | POA: Insufficient documentation

## 2024-07-08 DIAGNOSIS — R197 Diarrhea, unspecified: Secondary | ICD-10-CM

## 2024-07-08 LAB — URINALYSIS, ROUTINE W REFLEX MICROSCOPIC
Bilirubin Urine: NEGATIVE
Glucose, UA: NEGATIVE mg/dL
Hgb urine dipstick: NEGATIVE
Ketones, ur: NEGATIVE mg/dL
Leukocytes,Ua: NEGATIVE
Nitrite: NEGATIVE
Protein, ur: NEGATIVE mg/dL
Specific Gravity, Urine: 1.014 (ref 1.005–1.030)
pH: 5.5 (ref 5.0–8.0)

## 2024-07-08 LAB — COMPREHENSIVE METABOLIC PANEL WITH GFR
ALT: 21 U/L (ref 0–44)
AST: 23 U/L (ref 15–41)
Albumin: 4.6 g/dL (ref 3.5–5.0)
Alkaline Phosphatase: 74 U/L (ref 38–126)
Anion gap: 15 (ref 5–15)
BUN: 12 mg/dL (ref 6–20)
CO2: 25 mmol/L (ref 22–32)
Calcium: 9.7 mg/dL (ref 8.9–10.3)
Chloride: 97 mmol/L — ABNORMAL LOW (ref 98–111)
Creatinine, Ser: 0.79 mg/dL (ref 0.44–1.00)
GFR, Estimated: >60 mL/min
Glucose, Bld: 172 mg/dL — ABNORMAL HIGH (ref 70–99)
Potassium: 3.8 mmol/L (ref 3.5–5.1)
Sodium: 137 mmol/L (ref 135–145)
Total Bilirubin: 0.5 mg/dL (ref 0.0–1.2)
Total Protein: 7.4 g/dL (ref 6.5–8.1)

## 2024-07-08 LAB — CBC WITH DIFFERENTIAL/PLATELET
Abs Immature Granulocytes: 0.04 10*3/uL (ref 0.00–0.07)
Basophils Absolute: 0 10*3/uL (ref 0.0–0.1)
Basophils Relative: 0 %
Eosinophils Absolute: 0.1 10*3/uL (ref 0.0–0.5)
Eosinophils Relative: 1 %
HCT: 41.8 % (ref 36.0–46.0)
Hemoglobin: 13.9 g/dL (ref 12.0–15.0)
Immature Granulocytes: 0 %
Lymphocytes Relative: 14 %
Lymphs Abs: 1.5 10*3/uL (ref 0.7–4.0)
MCH: 28.7 pg (ref 26.0–34.0)
MCHC: 33.3 g/dL (ref 30.0–36.0)
MCV: 86.2 fL (ref 80.0–100.0)
Monocytes Absolute: 0.5 10*3/uL (ref 0.1–1.0)
Monocytes Relative: 5 %
Neutro Abs: 8.9 10*3/uL — ABNORMAL HIGH (ref 1.7–7.7)
Neutrophils Relative %: 80 %
Platelets: 344 10*3/uL (ref 150–400)
RBC: 4.85 MIL/uL (ref 3.87–5.11)
RDW: 12.4 % (ref 11.5–15.5)
WBC: 11.2 10*3/uL — ABNORMAL HIGH (ref 4.0–10.5)
nRBC: 0 % (ref 0.0–0.2)

## 2024-07-08 LAB — LACTIC ACID, PLASMA: Lactic Acid, Venous: 1.7 mmol/L (ref 0.5–1.9)

## 2024-07-08 LAB — LIPASE, BLOOD: Lipase: 19 U/L (ref 11–51)

## 2024-07-08 LAB — OCCULT BLOOD X 1 CARD TO LAB, STOOL: Fecal Occult Bld: POSITIVE — AB

## 2024-07-08 MED ORDER — SODIUM CHLORIDE 0.9 % IV BOLUS
1000.0000 mL | Freq: Once | INTRAVENOUS | Status: AC
  Administered 2024-07-08: 1000 mL via INTRAVENOUS

## 2024-07-08 MED ORDER — IOHEXOL 300 MG/ML  SOLN
100.0000 mL | Freq: Once | INTRAMUSCULAR | Status: AC | PRN
  Administered 2024-07-08: 100 mL via INTRAVENOUS

## 2024-07-08 NOTE — ED Provider Notes (Signed)
 " [place] EMERGENCY DEPARTMENT AT [facility] Provider Note   CSN: [id number] Arrival date & time: 07/08/24  1023     Patient presents with: Abdominal Pain   Shelly Lewis is a 49 y.o. female.   HPI   Presents because of abdominal pain.  Began yesterday.  Around 7 PM started with some generalized abdominal pain.  Subsequently now more slowly lower.  Slight burning sensation.  Started having multiple bowel movements.  Feeling nauseous.  No vomiting.  Loose stools.  Noted some bright red blood per rectum.  Patient states that the past 2-3 episodes of bowel movements seem to be more bright red blood as opposed to any kind of solid stool.  Patient states that she had a sigmoidoscopy in the past which was unremarkable.  No history of colonoscopy.  No fever no chills.  No sick contacts that she is aware of.  No weight loss.  No night sweats.  No history of any kind of ulcerative colitis or Crohn's.  No family history of any kind of colon cancer.  No recent antibiotic use.  Previous medical history reviewed : Primary care physician office in October 2025.  General wellness check    Prior to Admission medications  Medication Sig Start Date End Date Taking? Authorizing Provider  albuterol (VENTOLIN HFA) 108 (90 Base) MCG/ACT inhaler Inhale into the lungs every 6 (six) hours as needed for wheezing or shortness of breath.    [provider]  ALPRAZolam  (XANAX ) 0.5 MG tablet Take 1 tablet (0.5 mg total) by mouth 2 (two) times daily as needed for anxiety. Patient not taking: Reported on 03/17/2024 09/16/23   Allwardt, Mardy HERO, PA-C  Bacillus Coagulans-Inulin (ALIGN PREBIOTIC-PROBIOTIC PO) Take by mouth.    [provider]  blood glucose meter kit and supplies Dispense Contour next based on patient and insurance preference. Use up to 2 times daily as directed. Dx E11.9 05/01/21   Allwardt, Alyssa M, PA-C  busPIRone (BUSPAR) 7.5 MG tablet Take 7.5 mg by mouth daily as  needed. Patient taking differently: Take 7.5 mg by mouth daily as needed. Pt taking BID 01/18/21   [provider]  dicyclomine  (BENTYL ) 10 MG capsule Take 1 capsule (10 mg total) by mouth 4 (four) times daily -  before meals and at bedtime for 5 days. Take for diarrhea and cramping. Patient not taking: Reported on 03/17/2024 09/16/23 09/21/23  Allwardt, Mardy HERO, PA-C  escitalopram  (LEXAPRO ) 20 MG tablet  12/31/18   [provider]  glipiZIDE  (GLUCOTROL  XL) 2.5 MG 24 hr tablet TAKE 1 TABLET BY MOUTH EVERY DAY WITH BREAKFAST 02/16/24   Allwardt, Alyssa M, PA-C  glucose blood (FREESTYLE TEST STRIPS) test strip Use to monitor glucose up to TID 11/25/22   Allwardt, Mardy HERO, PA-C  Lancets 33G MISC Use as instructed daily 05/07/21   Allwardt, Alyssa M, PA-C  pravastatin  (PRAVACHOL ) 20 MG tablet Take 1 tablet (20 mg total) by mouth daily. 09/16/23   Allwardt, Alyssa M, PA-C  SUMAtriptan  (IMITREX ) 50 MG tablet Take 50 mg by mouth every 2 (two) hours as needed for migraine.    [provider]    Allergies: Azithromycin, Metformin  and related, Mold extract [trichophyton], Gluten meal, and Milk (cow)    Review of Systems  Constitutional:  Negative for chills and fever.  HENT:  Negative for ear pain and sore throat.   Eyes:  Negative for pain and visual disturbance.  Respiratory:  Negative for cough and shortness of breath.  Cardiovascular:  Negative for chest pain and palpitations.  Gastrointestinal:  Negative for abdominal pain and vomiting.  Genitourinary:  Negative for dysuria and hematuria.  Musculoskeletal:  Negative for arthralgias and back pain.  Skin:  Negative for color change and rash.  Neurological:  Negative for seizures and syncope.  All other systems reviewed and are negative.   Updated Vital Signs BP 116/70   Pulse 84   Temp 97.9 F (36.6 C)   Resp 15   Wt 72.6 kg   LMP 06/30/2024   SpO2 97%   BMI 29.26 kg/m   Physical Exam Vitals and nursing note  reviewed.  Constitutional:      General: She is not in acute distress.    Appearance: She is well-developed.  HENT:     Head: Normocephalic and atraumatic.  Eyes:     Conjunctiva/sclera: Conjunctivae normal.  Cardiovascular:     Rate and Rhythm: Normal rate and regular rhythm.     Heart sounds: No murmur heard. Pulmonary:     Effort: Pulmonary effort is normal. No respiratory distress.     Breath sounds: Normal breath sounds.  Abdominal:     Palpations: Abdomen is soft.     Tenderness: There is no abdominal tenderness.  Genitourinary:    Comments: Chaperone exam with RN Pegram: no external hemorrhoids. No anal fissures. Small amount of BRB on exam.  Musculoskeletal:        General: No swelling.     Cervical back: Neck supple.  Skin:    General: Skin is warm and dry.     Capillary Refill: Capillary refill takes less than 2 seconds.  Neurological:     Mental Status: She is alert.  Psychiatric:        Mood and Affect: Mood normal.     (all labs ordered are listed, but only abnormal results are displayed) Labs Reviewed  CBC WITH DIFFERENTIAL/PLATELET - Abnormal; Notable for the following components:      Result Value   WBC 11.2 (*)    Neutro Abs 8.9 (*)    All other components within normal limits  COMPREHENSIVE METABOLIC PANEL WITH GFR - Abnormal; Notable for the following components:   Chloride 97 (*)    Glucose, Bld 172 (*)    All other components within normal limits  OCCULT BLOOD X 1 CARD TO LAB, STOOL - Abnormal; Notable for the following components:   Fecal Occult Bld POSITIVE (*)    All other components within normal limits  LIPASE, BLOOD  URINALYSIS, ROUTINE W REFLEX MICROSCOPIC  LACTIC ACID, PLASMA  LACTIC ACID, PLASMA    EKG: EKG Interpretation Date/Time:  Thursday July 08 2024 10:59:38 EST Ventricular Rate:  98 PR Interval:  150 QRS Duration:  84 QT Interval:  361 QTC Calculation: 461 R Axis:   -16  Text Interpretation: Sinus rhythm Confirmed  by Simon Rea [phone]) on 07/08/2024 11:16:34 AM  Radiology: CT ABDOMEN PELVIS W CONTRAST Result Date: 07/08/2024 EXAM: CT ABDOMEN AND PELVIS WITH CONTRAST 07/08/2024 12:14:00 PM TECHNIQUE: CT of the abdomen and pelvis was performed with the administration of 100 mL of iohexol  (OMNIPAQUE ) 300 MG/ML solution. Multiplanar reformatted images are provided for review. Automated exposure control, iterative reconstruction, and/or weight-based adjustment of the mA/kV was utilized to reduce the radiation dose to as low as reasonably achievable. COMPARISON: None available. CLINICAL HISTORY: Left lower quadrant pain. Bloody diarrhea. Evaluate for diverticulitis. FINDINGS: LOWER CHEST: Posterior bibasilar dependent atelectasis. LIVER: Diffuse hepatic steatosis. GALLBLADDER AND BILE DUCTS: Gallbladder  is unremarkable. No biliary ductal dilatation. SPLEEN: No acute abnormality. PANCREAS: No acute abnormality. ADRENAL GLANDS: No acute abnormality. KIDNEYS, URETERS AND BLADDER: A couple of small nonobstructive calyceal calculi in both kidneys. No hydronephrosis. No perinephric or periureteral stranding. The urinary bladder is distended without focal abnormality. GI AND BOWEL: Decompressed stomach. Normal appendix. Moderate wall thickening of the distal transverse and proximal descending colon with pericolonic inflammation. There is no bowel obstruction. PERITONEUM AND RETROPERITONEUM: No ascites. No free air. No free pelvic fluid. VASCULATURE: Aorta is normal in caliber. Scattered aorta iliac atherosclerosis. LYMPH NODES: No lymphadenopathy. REPRODUCTIVE ORGANS: Small dominant follicle in the left ovary. BONES AND SOFT TISSUES: Multilevel thoracic osteophytosis. No acute osseous abnormality. No focal soft tissue abnormality. IMPRESSION: 1. Findings consistent with an infectious or inflammatory colitis of the distal transverse and proximal descending colon. Correlation with serum lactate recommended to exclude ischemic change.  2. Small nonobstructive calyceal calculi in both kidneys. No hydronephrosis. 3. Diffuse hepatic steatosis. Electronically signed by: Rogelia Myers MD 07/08/2024 01:33 PM EST RP Workstation: [id number]     Procedures   Medications Ordered in the ED  sodium chloride  0.9 % bolus 1,000 mL (0 mLs Intravenous Stopped 07/08/24 1234)  iohexol  (OMNIPAQUE ) 300 MG/ML solution 100 mL (100 mLs Intravenous Contrast Given 07/08/24 1214)                                    Medical Decision Making Amount and/or Complexity of Data Reviewed Labs: ordered. Radiology: ordered.  Risk Prescription drug management.     HPI:    Presents because of abdominal pain.  Began yesterday.  Around 7 PM started with some generalized abdominal pain.  Subsequently now more slowly lower.  Slight burning sensation.  Started having multiple bowel movements.  Feeling nauseous.  No vomiting.  Loose stools.  Noted some bright red blood per rectum.  Patient states that the past 2-3 episodes of bowel movements seem to be more bright red blood as opposed to any kind of solid stool.  Patient states that she had a sigmoidoscopy in the past which was unremarkable.  No history of colonoscopy.  No fever no chills.  No sick contacts that she is aware of.  No weight loss.  No night sweats.  No history of any kind of ulcerative colitis or Crohn's.  No family history of any kind of colon cancer.  No recent antibiotic use.  Previous medical history reviewed : Primary care physician office in October 2025.  General wellness check   MDM:   Upon examination, patient hemodynamically stable. A&O x 3 with GCS 15.  Concern for diverticulitis versus viral illness versus bacterial illness versus Crohn's versus ulcerative colitis.  No concerns for ischemic disease process.  Abdomen mostly soft and benign.Chaperone exam with RN Pegram: no external hemorrhoids. No anal fissures. Small amount of BRB on exam.   Sending off stool guaiac test.   Laboratory workup.  CBC to check for any kind of anemia.  Will obtain CT scan of the abdomen with contrast to assess for any kind of evidence of diverticulitis or colitis.   Reevaluation:   Upon reexamination, patient hemodynamically stable.  Remains A&O x 3 with GCS 15.  CT scan showed colitis of the distal transverse and proximal descending colon.  Presumed infectious or inflammatory.  No concerns for any kind of ischemic disease process.  She has no risk factors for this.  Lactic  acid negative.  No anion gap.  No history of A-fib.  No coagulation disorders.  No significant pain to palpation no pain out of proportion to be concerning for ischemic disease process.  Whether or not this would be inflammatory versus infectious.  Considered infectious given eating lunch and subsequently developing symptoms a couple hours later.  Hesitant to give antibiotics and situation given the bloody stool.  Could potentially make symptoms worse or could lead to other problems such as hemolytic uremic syndrome.  Once again, we will need to obtain stool infectious profile but patient unable to produce stool.  This will give us  more information.  Nevertheless, I do think she should follow-up with gastroenterology to rule out inflammatory disorder as well.  Wanted to send off a stool panel on the patient.  Patient was unable to produce stool here in the ED.  Currently no concerns for C. difficile infection.   Strict return precautions for the patient.  Worsening fever, bloody stool, diarrhea that she needs go back to the ED for further evaluation.  Interventions: 1 L NS      I have independently interpreted theCT  images and agree with the radiologist finding   Social Determinant of Health: Denies tobacco abuse    Disposition and Follow Up: GI       Final diagnoses:  Colitis  Diarrhea of presumed infectious origin    ED Discharge Orders     None          Simon Lavonia SAILOR, MD 07/08/24  1517  "

## 2024-07-08 NOTE — ED Notes (Signed)
 Pt unable to provide urine specimen at this time

## 2024-07-08 NOTE — Telephone Encounter (Signed)
 Please see triage note as FYI, pt advised to go to ED and pt agreeable

## 2024-07-08 NOTE — ED Notes (Signed)
 Patient transported to CT

## 2024-07-08 NOTE — Discharge Instructions (Addendum)
 As we discussed, it is important that you follow-up with gastroenterology.  You do have some inflammatory changes of your bowel.  I have no concern secondary to ischemic disease process.  This is more likely to be infectious or inflammatory.  We do not necessarily treat infectious bloody diarrhea with antibiotics.  Usually let it run its course.  But if your diarrhea gets worse or if you start having worsening bloody diarrhea then please come back to the ED for further evaluation.  GI will need to be involved because they will likely need a colonoscopy to rule out any kind of inflammatory condition such as Crohn's or ulcerative colitis.  Come back if you have worsening fever, diarrhea, bloody stool.  Make sure you stay well-hydrated.

## 2024-07-08 NOTE — ED Triage Notes (Signed)
 Pt c/o lower abd pain that started last night at 1700, bloody stools with diarrhea today

## 2024-07-08 NOTE — ED Notes (Signed)
Chaperone for EDP for rectal exam, hemoccult collection. Pt tolerated well.

## 2024-07-08 NOTE — ED Notes (Signed)
 ED Provider at bedside.

## 2024-07-08 NOTE — Telephone Encounter (Signed)
 FYI Only or Action Required?: FYI only for provider: ED advised.  Patient was last seen in primary care on 03/17/2024 by Allwardt, Mardy HERO, PA-C.  Called Nurse Triage reporting Rectal Bleeding.  Symptoms began today.  Interventions attempted: Nothing.  Symptoms are: unchanged.  Triage Disposition: Go to ED Now (Notify PCP)  Patient/caregiver understands and will follow disposition?: Yes   Reason for Disposition  [1] MODERATE rectal bleeding (e.g., small blood clots, passing blood without stool, or toilet water turns red) AND [2] more than once a day  Answer Assessment - Initial Assessment Questions Pt reports last night at 7pm starting with abd cramping and using the bathroom off and on all night, woke up at 6 am and as soon as she stood up, blood started leaking from rectum. Reports having just blood come out 5-10 times from 6am to now. Advised ED, patient agreeable.  Protocols used: Rectal Bleeding-A-AH  Message from [place] sent at 07/08/2024  9:21 AM EST  Reason for Triage: abd pain, bloody stools

## 2024-07-08 NOTE — ED Notes (Signed)
 Ambulatory to restroom

## 2024-07-08 NOTE — ED Notes (Signed)
 Reviewed discharge instructions and follow-up care with pt. Pt verbalized understanding and had no further questions. Pt exited ED without complications.

## 2024-07-09 ENCOUNTER — Telehealth: Payer: Self-pay

## 2024-07-09 NOTE — Telephone Encounter (Signed)
 Transition Care Management Follow-up Telephone Call Date of discharge and from where: 07/08/2024 How have you been since you were released from the hospital? NO Any questions or concerns? No  Items Reviewed: Did the pt receive and understand the discharge instructions provided? Yes  Medications obtained and verified? Yes  Any new allergies since your discharge? No  Dietary orders reviewed? Yes Do you have support at home? Yes   Home Care and Equipment/Supplies: Were home health services ordered? not applicable Has the agency set up a time to come to the patient's home? not applicable Were any new equipment or medical supplies ordered?  No Were you able to get the supplies/equipment? not applicable Do you have any questions related to the use of the equipment or supplies? No  Functional Questionnaire: (I = Independent and D = Dependent) ADLs: I  Bathing/Dressing- I  Meal Prep- I  Eating- I  Maintaining continence- I  Transferring/Ambulation- I  Managing Meds- I  Follow up appointments reviewed:  PCP Hospital f/u appt confirmed? Yes  Scheduled to see Alyssa Allwardt, PA-C on 07/20/2024 @ 0800. Specialist Hospital f/u appt confirmed? No   Are transportation arrangements needed? No  If their condition worsens, is the pt aware to call PCP or go to the Emergency Dept.? Yes Was the patient provided with contact information for the PCP's office or ED? Yes Was to pt encouraged to call back with questions or concerns? Yes

## 2024-07-13 ENCOUNTER — Encounter: Payer: Self-pay | Admitting: Physician Assistant

## 2024-07-13 ENCOUNTER — Ambulatory Visit: Admitting: Physician Assistant

## 2024-07-13 VITALS — BP 116/88 | HR 77 | Temp 97.3°F | Ht 62.0 in | Wt 158.0 lb

## 2024-07-13 DIAGNOSIS — N200 Calculus of kidney: Secondary | ICD-10-CM | POA: Insufficient documentation

## 2024-07-13 DIAGNOSIS — Z23 Encounter for immunization: Secondary | ICD-10-CM | POA: Diagnosis not present

## 2024-07-13 DIAGNOSIS — K760 Fatty (change of) liver, not elsewhere classified: Secondary | ICD-10-CM | POA: Diagnosis not present

## 2024-07-13 DIAGNOSIS — K625 Hemorrhage of anus and rectum: Secondary | ICD-10-CM | POA: Diagnosis not present

## 2024-07-13 DIAGNOSIS — K529 Noninfective gastroenteritis and colitis, unspecified: Secondary | ICD-10-CM | POA: Diagnosis not present

## 2024-07-13 NOTE — Patient Instructions (Addendum)
 Please call [facility] to schedule your colonoscopy. [phone]  Glad you are feeling better overall!

## 2024-07-13 NOTE — Telephone Encounter (Signed)
 FYI- Also added to Family HX

## 2024-07-14 NOTE — Addendum Note (Signed)
 Addended by: CRISTOPHER TAWNI BROCKS on: 07/14/2024 08:19 AM   Modules accepted: Orders

## 2024-07-14 NOTE — Addendum Note (Signed)
 Addended by: CRISTOPHER TAWNI BROCKS on: 07/14/2024 08:13 AM   Modules accepted: Orders

## 2024-07-20 ENCOUNTER — Ambulatory Visit: Admitting: Physician Assistant

## 2024-09-15 ENCOUNTER — Ambulatory Visit: Admitting: Physician Assistant
